# Patient Record
Sex: Male | Born: 1960 | Race: White | Hispanic: No | Marital: Married | Smoking: Never smoker
Health system: Southern US, Community
[De-identification: ages and names within clinical notes are randomized; demographics above are authoritative.]

## PROBLEM LIST (undated history)

## (undated) DIAGNOSIS — F419 Anxiety disorder, unspecified: Secondary | ICD-10-CM

## (undated) DIAGNOSIS — C61 Malignant neoplasm of prostate: Secondary | ICD-10-CM

## (undated) DIAGNOSIS — E119 Type 2 diabetes mellitus without complications: Secondary | ICD-10-CM

## (undated) DIAGNOSIS — F32A Depression, unspecified: Secondary | ICD-10-CM

## (undated) DIAGNOSIS — C4490 Unspecified malignant neoplasm of skin, unspecified: Secondary | ICD-10-CM

## (undated) DIAGNOSIS — D229 Melanocytic nevi, unspecified: Secondary | ICD-10-CM

## (undated) HISTORY — PX: PROSTATE BIOPSY: SHX241

## (undated) HISTORY — PX: SKIN CANCER EXCISION: SHX779

## (undated) HISTORY — PX: COLONOSCOPY: SHX174

## (undated) HISTORY — DX: Melanocytic nevi, unspecified: D22.9

---

## 2009-02-23 ENCOUNTER — Encounter: Admission: RE | Admit: 2009-02-23 | Discharge: 2009-02-23 | Payer: Self-pay | Admitting: Family Medicine

## 2010-03-02 DIAGNOSIS — C4492 Squamous cell carcinoma of skin, unspecified: Secondary | ICD-10-CM

## 2010-03-02 HISTORY — DX: Squamous cell carcinoma of skin, unspecified: C44.92

## 2010-07-04 ENCOUNTER — Encounter: Payer: Self-pay | Admitting: Family Medicine

## 2014-09-24 ENCOUNTER — Ambulatory Visit
Admission: RE | Admit: 2014-09-24 | Discharge: 2014-09-24 | Disposition: A | Discharge status: Home / Self Care | Payer: 59 | Source: Ambulatory Visit | Attending: Family Medicine | Admitting: Family Medicine

## 2014-09-24 ENCOUNTER — Other Ambulatory Visit: Payer: Self-pay | Admitting: Family Medicine

## 2014-09-24 DIAGNOSIS — R059 Cough, unspecified: Secondary | ICD-10-CM

## 2014-09-24 DIAGNOSIS — R05 Cough: Secondary | ICD-10-CM

## 2015-08-23 IMAGING — CR DG CHEST 2V
2 series · 2 of 2 positions shown · non-contrast
Comparison: Chest radiograph 02/23/2009

CLINICAL DATA: Cough for 1 month.  Wheezing at night.

EXAM:
CHEST  2 VIEW

[w chest pa]
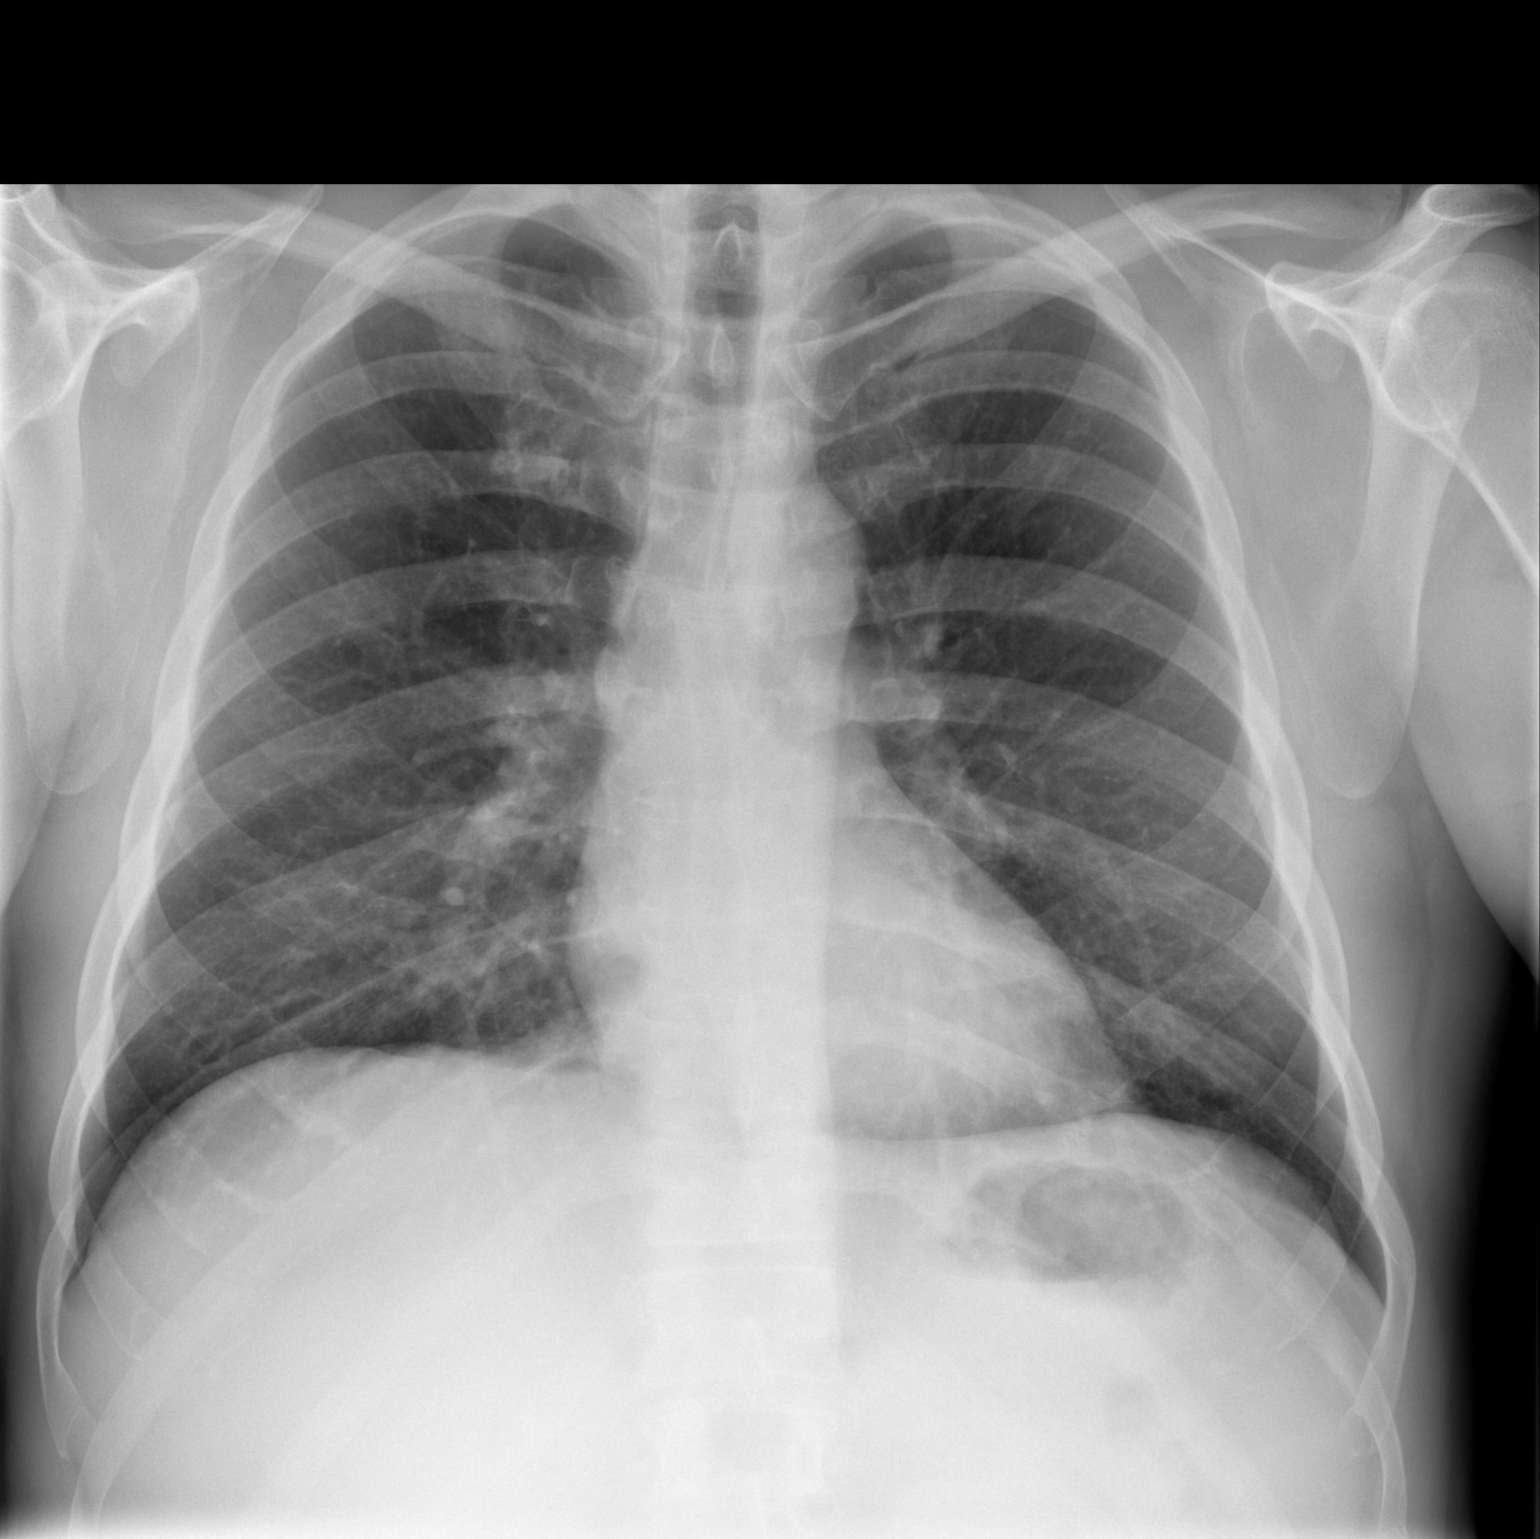

[w chest lat]
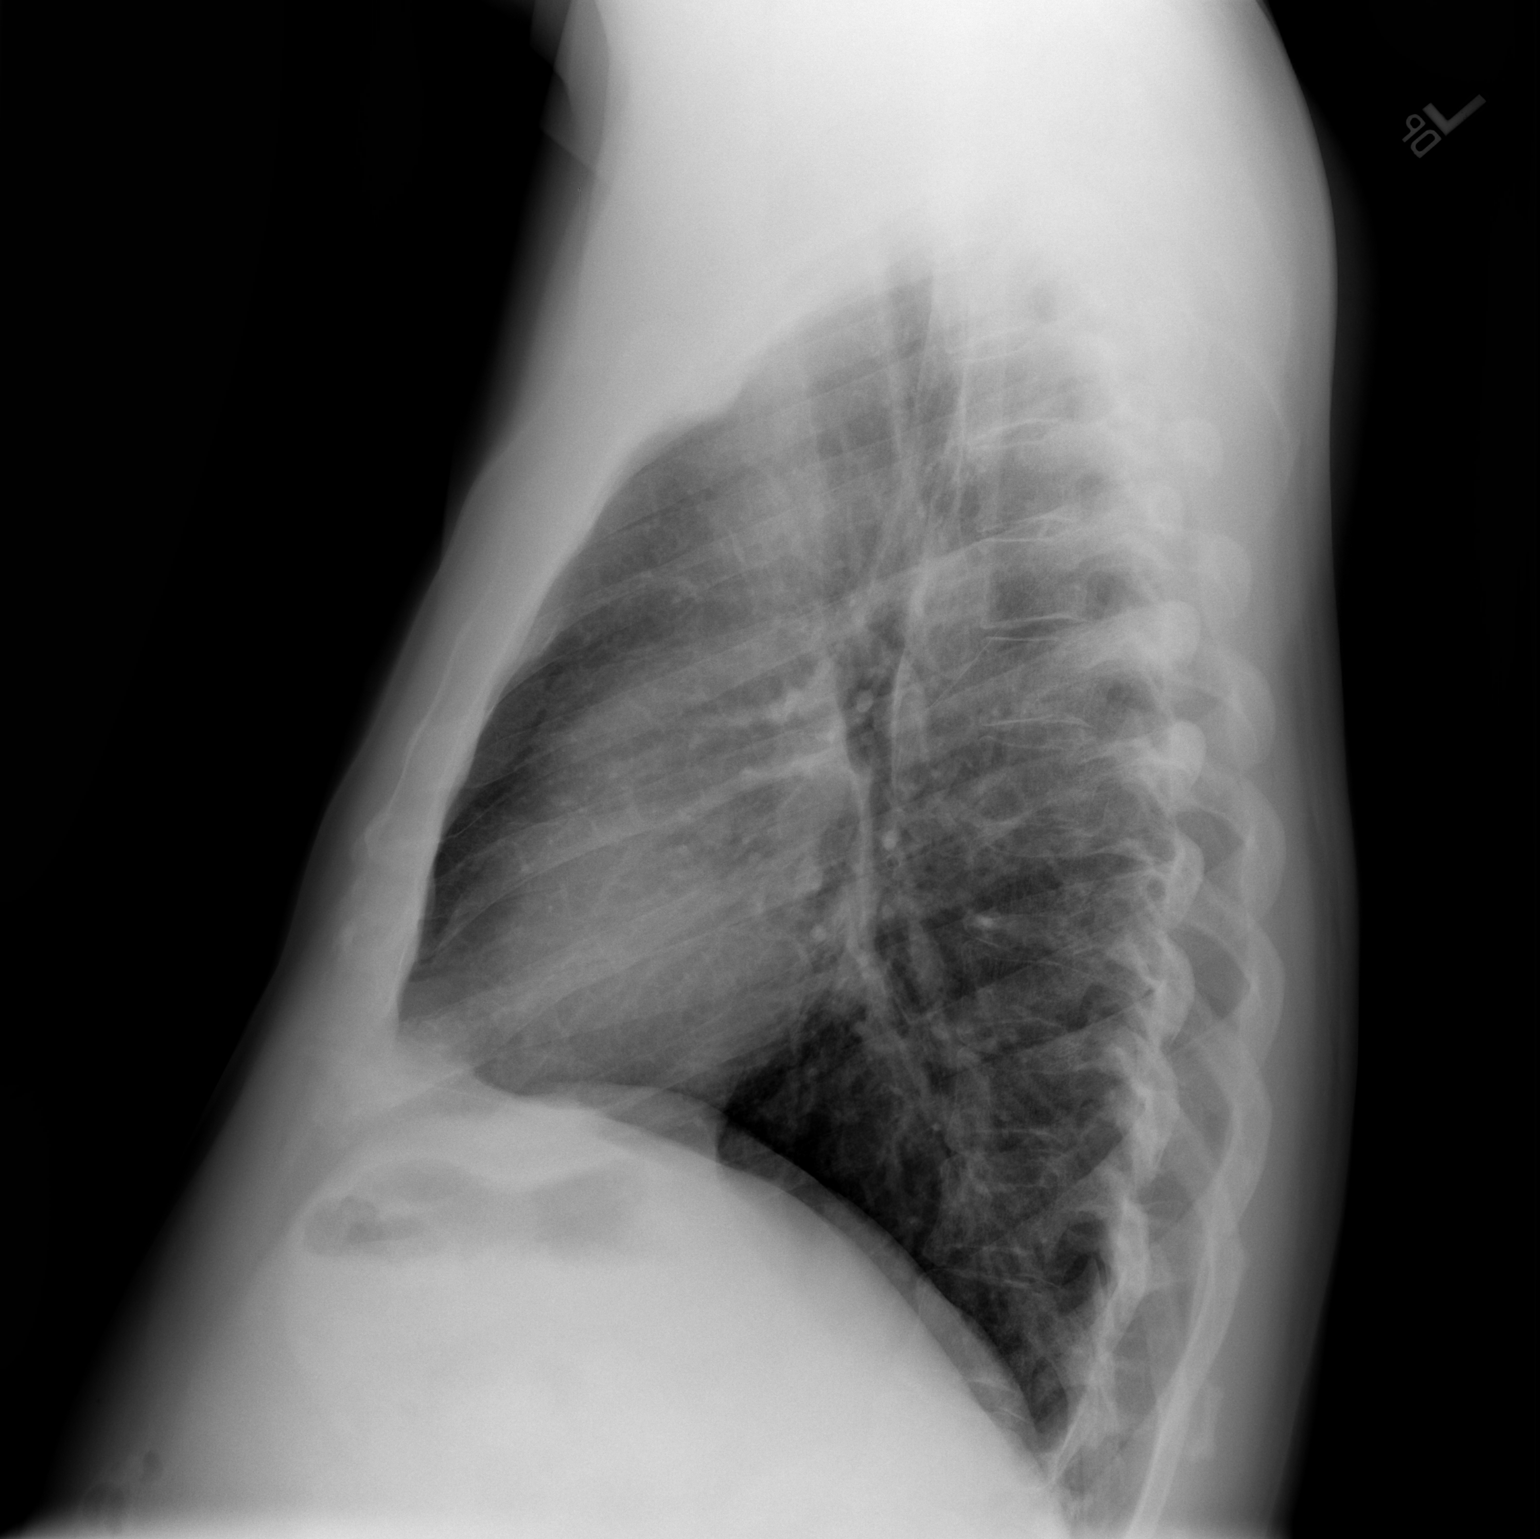

[2 of 2 positions shown; findings below may reference images not displayed]

FINDINGS: Normal mediastinum and cardiac silhouette. Normal pulmonary
vasculature. No evidence of effusion, infiltrate, or pneumothorax.
No acute bony abnormality.
IMPRESSION: No acute cardiopulmonary process.  No change from prior.

## 2015-11-16 DIAGNOSIS — E782 Mixed hyperlipidemia: Secondary | ICD-10-CM | POA: Diagnosis not present

## 2015-11-16 DIAGNOSIS — E039 Hypothyroidism, unspecified: Secondary | ICD-10-CM | POA: Diagnosis not present

## 2015-11-16 DIAGNOSIS — E113299 Type 2 diabetes mellitus with mild nonproliferative diabetic retinopathy without macular edema, unspecified eye: Secondary | ICD-10-CM | POA: Diagnosis not present

## 2015-11-16 DIAGNOSIS — R809 Proteinuria, unspecified: Secondary | ICD-10-CM | POA: Diagnosis not present

## 2016-01-12 DIAGNOSIS — D485 Neoplasm of uncertain behavior of skin: Secondary | ICD-10-CM | POA: Diagnosis not present

## 2016-01-12 DIAGNOSIS — L570 Actinic keratosis: Secondary | ICD-10-CM | POA: Diagnosis not present

## 2016-02-01 DIAGNOSIS — H40053 Ocular hypertension, bilateral: Secondary | ICD-10-CM | POA: Diagnosis not present

## 2016-03-09 DIAGNOSIS — M79604 Pain in right leg: Secondary | ICD-10-CM | POA: Diagnosis not present

## 2016-05-17 DIAGNOSIS — E039 Hypothyroidism, unspecified: Secondary | ICD-10-CM | POA: Diagnosis not present

## 2016-05-17 DIAGNOSIS — R809 Proteinuria, unspecified: Secondary | ICD-10-CM | POA: Diagnosis not present

## 2016-05-17 DIAGNOSIS — E113299 Type 2 diabetes mellitus with mild nonproliferative diabetic retinopathy without macular edema, unspecified eye: Secondary | ICD-10-CM | POA: Diagnosis not present

## 2016-05-17 DIAGNOSIS — E782 Mixed hyperlipidemia: Secondary | ICD-10-CM | POA: Diagnosis not present

## 2016-09-19 DIAGNOSIS — J029 Acute pharyngitis, unspecified: Secondary | ICD-10-CM | POA: Diagnosis not present

## 2016-11-15 DIAGNOSIS — Z0000 Encounter for general adult medical examination without abnormal findings: Secondary | ICD-10-CM | POA: Diagnosis not present

## 2016-11-15 DIAGNOSIS — Z7984 Long term (current) use of oral hypoglycemic drugs: Secondary | ICD-10-CM | POA: Diagnosis not present

## 2016-11-15 DIAGNOSIS — E782 Mixed hyperlipidemia: Secondary | ICD-10-CM | POA: Diagnosis not present

## 2016-11-15 DIAGNOSIS — E1165 Type 2 diabetes mellitus with hyperglycemia: Secondary | ICD-10-CM | POA: Diagnosis not present

## 2016-11-15 DIAGNOSIS — E039 Hypothyroidism, unspecified: Secondary | ICD-10-CM | POA: Diagnosis not present

## 2016-11-15 DIAGNOSIS — R809 Proteinuria, unspecified: Secondary | ICD-10-CM | POA: Diagnosis not present

## 2016-11-15 DIAGNOSIS — Z125 Encounter for screening for malignant neoplasm of prostate: Secondary | ICD-10-CM | POA: Diagnosis not present

## 2016-12-28 DIAGNOSIS — C4492 Squamous cell carcinoma of skin, unspecified: Secondary | ICD-10-CM

## 2016-12-28 DIAGNOSIS — D229 Melanocytic nevi, unspecified: Secondary | ICD-10-CM | POA: Diagnosis not present

## 2016-12-28 DIAGNOSIS — L570 Actinic keratosis: Secondary | ICD-10-CM | POA: Diagnosis not present

## 2016-12-28 DIAGNOSIS — D0439 Carcinoma in situ of skin of other parts of face: Secondary | ICD-10-CM | POA: Diagnosis not present

## 2016-12-28 HISTORY — DX: Squamous cell carcinoma of skin, unspecified: C44.92

## 2017-01-19 DIAGNOSIS — D0439 Carcinoma in situ of skin of other parts of face: Secondary | ICD-10-CM | POA: Diagnosis not present

## 2017-02-20 DIAGNOSIS — E1165 Type 2 diabetes mellitus with hyperglycemia: Secondary | ICD-10-CM | POA: Diagnosis not present

## 2017-04-06 DIAGNOSIS — D492 Neoplasm of unspecified behavior of bone, soft tissue, and skin: Secondary | ICD-10-CM | POA: Diagnosis not present

## 2017-05-19 DIAGNOSIS — E039 Hypothyroidism, unspecified: Secondary | ICD-10-CM | POA: Diagnosis not present

## 2017-05-19 DIAGNOSIS — R809 Proteinuria, unspecified: Secondary | ICD-10-CM | POA: Diagnosis not present

## 2017-05-19 DIAGNOSIS — E782 Mixed hyperlipidemia: Secondary | ICD-10-CM | POA: Diagnosis not present

## 2017-05-19 DIAGNOSIS — E113293 Type 2 diabetes mellitus with mild nonproliferative diabetic retinopathy without macular edema, bilateral: Secondary | ICD-10-CM | POA: Diagnosis not present

## 2017-05-19 DIAGNOSIS — E113299 Type 2 diabetes mellitus with mild nonproliferative diabetic retinopathy without macular edema, unspecified eye: Secondary | ICD-10-CM | POA: Diagnosis not present

## 2017-08-23 DIAGNOSIS — R6884 Jaw pain: Secondary | ICD-10-CM | POA: Diagnosis not present

## 2017-12-12 DIAGNOSIS — Z0000 Encounter for general adult medical examination without abnormal findings: Secondary | ICD-10-CM | POA: Diagnosis not present

## 2017-12-12 DIAGNOSIS — Z125 Encounter for screening for malignant neoplasm of prostate: Secondary | ICD-10-CM | POA: Diagnosis not present

## 2017-12-12 DIAGNOSIS — Z1159 Encounter for screening for other viral diseases: Secondary | ICD-10-CM | POA: Diagnosis not present

## 2017-12-12 DIAGNOSIS — E039 Hypothyroidism, unspecified: Secondary | ICD-10-CM | POA: Diagnosis not present

## 2017-12-12 DIAGNOSIS — E782 Mixed hyperlipidemia: Secondary | ICD-10-CM | POA: Diagnosis not present

## 2017-12-12 DIAGNOSIS — E113299 Type 2 diabetes mellitus with mild nonproliferative diabetic retinopathy without macular edema, unspecified eye: Secondary | ICD-10-CM | POA: Diagnosis not present

## 2018-02-13 DIAGNOSIS — F419 Anxiety disorder, unspecified: Secondary | ICD-10-CM | POA: Diagnosis not present

## 2018-02-20 DIAGNOSIS — G5601 Carpal tunnel syndrome, right upper limb: Secondary | ICD-10-CM | POA: Diagnosis not present

## 2018-02-20 DIAGNOSIS — R202 Paresthesia of skin: Secondary | ICD-10-CM | POA: Diagnosis not present

## 2018-02-20 DIAGNOSIS — M5417 Radiculopathy, lumbosacral region: Secondary | ICD-10-CM | POA: Diagnosis not present

## 2018-02-20 DIAGNOSIS — G5621 Lesion of ulnar nerve, right upper limb: Secondary | ICD-10-CM | POA: Diagnosis not present

## 2018-02-20 DIAGNOSIS — G3184 Mild cognitive impairment, so stated: Secondary | ICD-10-CM | POA: Diagnosis not present

## 2018-02-20 DIAGNOSIS — R201 Hypoesthesia of skin: Secondary | ICD-10-CM | POA: Diagnosis not present

## 2018-02-21 DIAGNOSIS — F419 Anxiety disorder, unspecified: Secondary | ICD-10-CM | POA: Diagnosis not present

## 2018-02-23 DIAGNOSIS — R202 Paresthesia of skin: Secondary | ICD-10-CM | POA: Diagnosis not present

## 2018-02-23 DIAGNOSIS — R413 Other amnesia: Secondary | ICD-10-CM | POA: Diagnosis not present

## 2018-02-23 DIAGNOSIS — R634 Abnormal weight loss: Secondary | ICD-10-CM | POA: Diagnosis not present

## 2018-03-07 DIAGNOSIS — F419 Anxiety disorder, unspecified: Secondary | ICD-10-CM | POA: Diagnosis not present

## 2018-03-14 DIAGNOSIS — M5417 Radiculopathy, lumbosacral region: Secondary | ICD-10-CM | POA: Diagnosis not present

## 2018-03-14 DIAGNOSIS — G3184 Mild cognitive impairment, so stated: Secondary | ICD-10-CM | POA: Diagnosis not present

## 2018-03-14 DIAGNOSIS — G5621 Lesion of ulnar nerve, right upper limb: Secondary | ICD-10-CM | POA: Diagnosis not present

## 2018-03-21 DIAGNOSIS — F419 Anxiety disorder, unspecified: Secondary | ICD-10-CM | POA: Diagnosis not present

## 2018-04-11 DIAGNOSIS — F419 Anxiety disorder, unspecified: Secondary | ICD-10-CM | POA: Diagnosis not present

## 2018-04-12 DIAGNOSIS — G3184 Mild cognitive impairment, so stated: Secondary | ICD-10-CM | POA: Diagnosis not present

## 2018-04-12 DIAGNOSIS — M5417 Radiculopathy, lumbosacral region: Secondary | ICD-10-CM | POA: Diagnosis not present

## 2018-04-12 DIAGNOSIS — R202 Paresthesia of skin: Secondary | ICD-10-CM | POA: Diagnosis not present

## 2018-04-12 DIAGNOSIS — R270 Ataxia, unspecified: Secondary | ICD-10-CM | POA: Diagnosis not present

## 2018-04-25 DIAGNOSIS — F419 Anxiety disorder, unspecified: Secondary | ICD-10-CM | POA: Diagnosis not present

## 2018-05-16 DIAGNOSIS — F419 Anxiety disorder, unspecified: Secondary | ICD-10-CM | POA: Diagnosis not present

## 2018-05-24 DIAGNOSIS — M5417 Radiculopathy, lumbosacral region: Secondary | ICD-10-CM | POA: Diagnosis not present

## 2018-05-24 DIAGNOSIS — R270 Ataxia, unspecified: Secondary | ICD-10-CM | POA: Diagnosis not present

## 2018-05-24 DIAGNOSIS — G5601 Carpal tunnel syndrome, right upper limb: Secondary | ICD-10-CM | POA: Diagnosis not present

## 2018-05-24 DIAGNOSIS — G3184 Mild cognitive impairment, so stated: Secondary | ICD-10-CM | POA: Diagnosis not present

## 2018-06-26 DIAGNOSIS — E113293 Type 2 diabetes mellitus with mild nonproliferative diabetic retinopathy without macular edema, bilateral: Secondary | ICD-10-CM | POA: Diagnosis not present

## 2018-06-26 DIAGNOSIS — E039 Hypothyroidism, unspecified: Secondary | ICD-10-CM | POA: Diagnosis not present

## 2018-06-26 DIAGNOSIS — R809 Proteinuria, unspecified: Secondary | ICD-10-CM | POA: Diagnosis not present

## 2018-06-26 DIAGNOSIS — E782 Mixed hyperlipidemia: Secondary | ICD-10-CM | POA: Diagnosis not present

## 2018-08-08 DIAGNOSIS — R202 Paresthesia of skin: Secondary | ICD-10-CM | POA: Diagnosis not present

## 2018-08-08 DIAGNOSIS — M5417 Radiculopathy, lumbosacral region: Secondary | ICD-10-CM | POA: Diagnosis not present

## 2018-08-08 DIAGNOSIS — G5621 Lesion of ulnar nerve, right upper limb: Secondary | ICD-10-CM | POA: Diagnosis not present

## 2018-08-08 DIAGNOSIS — G3184 Mild cognitive impairment, so stated: Secondary | ICD-10-CM | POA: Diagnosis not present

## 2018-10-31 DIAGNOSIS — M5417 Radiculopathy, lumbosacral region: Secondary | ICD-10-CM | POA: Diagnosis not present

## 2018-10-31 DIAGNOSIS — G5601 Carpal tunnel syndrome, right upper limb: Secondary | ICD-10-CM | POA: Diagnosis not present

## 2018-10-31 DIAGNOSIS — G3184 Mild cognitive impairment, so stated: Secondary | ICD-10-CM | POA: Diagnosis not present

## 2018-10-31 DIAGNOSIS — R413 Other amnesia: Secondary | ICD-10-CM | POA: Diagnosis not present

## 2018-11-22 DIAGNOSIS — R5383 Other fatigue: Secondary | ICD-10-CM | POA: Diagnosis not present

## 2018-11-29 DIAGNOSIS — S80211A Abrasion, right knee, initial encounter: Secondary | ICD-10-CM | POA: Diagnosis not present

## 2018-11-29 DIAGNOSIS — S80212A Abrasion, left knee, initial encounter: Secondary | ICD-10-CM | POA: Diagnosis not present

## 2018-11-29 DIAGNOSIS — S60511A Abrasion of right hand, initial encounter: Secondary | ICD-10-CM | POA: Diagnosis not present

## 2018-11-29 DIAGNOSIS — E860 Dehydration: Secondary | ICD-10-CM | POA: Diagnosis not present

## 2018-12-27 DIAGNOSIS — E113293 Type 2 diabetes mellitus with mild nonproliferative diabetic retinopathy without macular edema, bilateral: Secondary | ICD-10-CM | POA: Diagnosis not present

## 2018-12-27 DIAGNOSIS — E039 Hypothyroidism, unspecified: Secondary | ICD-10-CM | POA: Diagnosis not present

## 2018-12-27 DIAGNOSIS — E782 Mixed hyperlipidemia: Secondary | ICD-10-CM | POA: Diagnosis not present

## 2018-12-27 DIAGNOSIS — R809 Proteinuria, unspecified: Secondary | ICD-10-CM | POA: Diagnosis not present

## 2018-12-28 DIAGNOSIS — R809 Proteinuria, unspecified: Secondary | ICD-10-CM | POA: Diagnosis not present

## 2018-12-28 DIAGNOSIS — E039 Hypothyroidism, unspecified: Secondary | ICD-10-CM | POA: Diagnosis not present

## 2018-12-28 DIAGNOSIS — E782 Mixed hyperlipidemia: Secondary | ICD-10-CM | POA: Diagnosis not present

## 2018-12-28 DIAGNOSIS — E113293 Type 2 diabetes mellitus with mild nonproliferative diabetic retinopathy without macular edema, bilateral: Secondary | ICD-10-CM | POA: Diagnosis not present

## 2018-12-28 DIAGNOSIS — Z125 Encounter for screening for malignant neoplasm of prostate: Secondary | ICD-10-CM | POA: Diagnosis not present

## 2019-01-16 DIAGNOSIS — G5601 Carpal tunnel syndrome, right upper limb: Secondary | ICD-10-CM | POA: Diagnosis not present

## 2019-01-16 DIAGNOSIS — G3184 Mild cognitive impairment, so stated: Secondary | ICD-10-CM | POA: Diagnosis not present

## 2019-01-16 DIAGNOSIS — M5417 Radiculopathy, lumbosacral region: Secondary | ICD-10-CM | POA: Diagnosis not present

## 2019-01-16 DIAGNOSIS — R202 Paresthesia of skin: Secondary | ICD-10-CM | POA: Diagnosis not present

## 2019-01-16 DIAGNOSIS — G5621 Lesion of ulnar nerve, right upper limb: Secondary | ICD-10-CM | POA: Diagnosis not present

## 2019-02-27 DIAGNOSIS — G3184 Mild cognitive impairment, so stated: Secondary | ICD-10-CM | POA: Diagnosis not present

## 2019-02-27 DIAGNOSIS — G5623 Lesion of ulnar nerve, bilateral upper limbs: Secondary | ICD-10-CM | POA: Diagnosis not present

## 2019-02-27 DIAGNOSIS — M5412 Radiculopathy, cervical region: Secondary | ICD-10-CM | POA: Diagnosis not present

## 2019-02-27 DIAGNOSIS — M5417 Radiculopathy, lumbosacral region: Secondary | ICD-10-CM | POA: Diagnosis not present

## 2019-02-27 DIAGNOSIS — G5603 Carpal tunnel syndrome, bilateral upper limbs: Secondary | ICD-10-CM | POA: Diagnosis not present

## 2019-04-17 DIAGNOSIS — G5603 Carpal tunnel syndrome, bilateral upper limbs: Secondary | ICD-10-CM | POA: Diagnosis not present

## 2019-04-17 DIAGNOSIS — M542 Cervicalgia: Secondary | ICD-10-CM | POA: Diagnosis not present

## 2019-04-17 DIAGNOSIS — M5417 Radiculopathy, lumbosacral region: Secondary | ICD-10-CM | POA: Diagnosis not present

## 2019-04-17 DIAGNOSIS — R201 Hypoesthesia of skin: Secondary | ICD-10-CM | POA: Diagnosis not present

## 2019-04-17 DIAGNOSIS — G3184 Mild cognitive impairment, so stated: Secondary | ICD-10-CM | POA: Diagnosis not present

## 2019-07-15 DIAGNOSIS — E039 Hypothyroidism, unspecified: Secondary | ICD-10-CM | POA: Diagnosis not present

## 2019-07-15 DIAGNOSIS — E782 Mixed hyperlipidemia: Secondary | ICD-10-CM | POA: Diagnosis not present

## 2019-07-15 DIAGNOSIS — R809 Proteinuria, unspecified: Secondary | ICD-10-CM | POA: Diagnosis not present

## 2019-07-15 DIAGNOSIS — E113293 Type 2 diabetes mellitus with mild nonproliferative diabetic retinopathy without macular edema, bilateral: Secondary | ICD-10-CM | POA: Diagnosis not present

## 2019-07-17 DIAGNOSIS — G3184 Mild cognitive impairment, so stated: Secondary | ICD-10-CM | POA: Diagnosis not present

## 2019-07-17 DIAGNOSIS — M542 Cervicalgia: Secondary | ICD-10-CM | POA: Diagnosis not present

## 2019-07-17 DIAGNOSIS — M5417 Radiculopathy, lumbosacral region: Secondary | ICD-10-CM | POA: Diagnosis not present

## 2019-07-17 DIAGNOSIS — G5603 Carpal tunnel syndrome, bilateral upper limbs: Secondary | ICD-10-CM | POA: Diagnosis not present

## 2020-01-13 DIAGNOSIS — E782 Mixed hyperlipidemia: Secondary | ICD-10-CM | POA: Diagnosis not present

## 2020-01-13 DIAGNOSIS — E113299 Type 2 diabetes mellitus with mild nonproliferative diabetic retinopathy without macular edema, unspecified eye: Secondary | ICD-10-CM | POA: Diagnosis not present

## 2020-01-13 DIAGNOSIS — R809 Proteinuria, unspecified: Secondary | ICD-10-CM | POA: Diagnosis not present

## 2020-01-13 DIAGNOSIS — E039 Hypothyroidism, unspecified: Secondary | ICD-10-CM | POA: Diagnosis not present

## 2020-04-01 DIAGNOSIS — I10 Essential (primary) hypertension: Secondary | ICD-10-CM | POA: Diagnosis not present

## 2020-04-01 DIAGNOSIS — M5416 Radiculopathy, lumbar region: Secondary | ICD-10-CM | POA: Diagnosis not present

## 2020-04-14 DIAGNOSIS — M5416 Radiculopathy, lumbar region: Secondary | ICD-10-CM | POA: Diagnosis not present

## 2020-04-14 DIAGNOSIS — M545 Low back pain, unspecified: Secondary | ICD-10-CM | POA: Diagnosis not present

## 2020-04-22 DIAGNOSIS — M5416 Radiculopathy, lumbar region: Secondary | ICD-10-CM | POA: Diagnosis not present

## 2020-05-20 DIAGNOSIS — I10 Essential (primary) hypertension: Secondary | ICD-10-CM | POA: Diagnosis not present

## 2020-05-20 DIAGNOSIS — Z6825 Body mass index (BMI) 25.0-25.9, adult: Secondary | ICD-10-CM | POA: Diagnosis not present

## 2020-05-20 DIAGNOSIS — M5416 Radiculopathy, lumbar region: Secondary | ICD-10-CM | POA: Diagnosis not present

## 2020-06-10 ENCOUNTER — Ambulatory Visit (INDEPENDENT_AMBULATORY_CARE_PROVIDER_SITE_OTHER): Payer: BC Managed Care – PPO | Admitting: Dermatology

## 2020-06-10 ENCOUNTER — Encounter: Payer: Self-pay | Admitting: Dermatology

## 2020-06-10 ENCOUNTER — Other Ambulatory Visit: Payer: Self-pay

## 2020-06-10 DIAGNOSIS — Z86007 Personal history of in-situ neoplasm of skin: Secondary | ICD-10-CM

## 2020-06-10 DIAGNOSIS — Z1283 Encounter for screening for malignant neoplasm of skin: Secondary | ICD-10-CM

## 2020-06-10 DIAGNOSIS — Z86018 Personal history of other benign neoplasm: Secondary | ICD-10-CM | POA: Diagnosis not present

## 2020-06-10 DIAGNOSIS — L570 Actinic keratosis: Secondary | ICD-10-CM | POA: Diagnosis not present

## 2020-06-10 DIAGNOSIS — L309 Dermatitis, unspecified: Secondary | ICD-10-CM | POA: Diagnosis not present

## 2020-06-10 DIAGNOSIS — C4491 Basal cell carcinoma of skin, unspecified: Secondary | ICD-10-CM

## 2020-06-10 DIAGNOSIS — L821 Other seborrheic keratosis: Secondary | ICD-10-CM

## 2020-06-10 DIAGNOSIS — C44612 Basal cell carcinoma of skin of right upper limb, including shoulder: Secondary | ICD-10-CM

## 2020-06-10 HISTORY — DX: Basal cell carcinoma of skin, unspecified: C44.91

## 2020-06-10 MED ORDER — BETAMETHASONE DIPROPIONATE 0.05 % EX CREA: TOPICAL_CREAM | Freq: Every evening | CUTANEOUS | 11 refills | Status: DC

## 2020-06-10 NOTE — Patient Instructions (Signed)

## 2020-06-15 ENCOUNTER — Encounter: Payer: Self-pay | Admitting: Dermatology

## 2020-06-16 NOTE — Progress Notes (Addendum)
New Patient   Subjective  Ricardo Lutz is a 60 y.o. male who presents for the following: Annual Exam (Under left eye right new just a few weeks, forehead and right forearm crust x months).  Several new growths face and right arm Location:  Duration:  Quality:  Associated Signs/Symptoms: Modifying Factors:  Severity:  Timing: Context:    The following portions of the chart were reviewed this encounter and updated as appropriate:  Tobacco  Allergies  Meds  Problems  Med Hx  Surg Hx  Fam Hx      Objective  Well appearing patient in no apparent distress; mood and affect are within normal limits. Objective  Head - Anterior (Face): Head to leg exam today no signs of atypical moles, or melanoma.  Objective  Left Chest, Left Side, Right Outer Abdomen, Right Sup Umbilicus: No sign recurrence  Objective  Left Cheek, Mid Tip of Nose: No sign recurrence  Objective  Right Lower Leg - Anterior: Mildly inflamed nonmarginated 1.5 cm eczematous circular patch  Objective  Left Lower Leg - Anterior, Mid Back: Flattopped brown 4 to 5 mm papules  Objective  Left Buccal Cheek, Left Malar Cheek, Right Forehead: Erythematous patches with gritty scale.  Objective  Right Inner Forearm: Flat pink crust        All skin waist up examined.   Assessment & Plan  Skin exam for malignant neoplasm Head - Anterior (Face)  Yearly skin check  History of atypical skin mole (4) Left Chest; Right Outer Abdomen; Left Side; Right Sup Umbilicus  Encouraged to self examine his skin twice annually  History of squamous cell carcinoma in situ (SCCIS) of skin (2) Mid Tip of Nose; Left Cheek  Recheck as needed clinical change  Eczema, unspecified type Right Lower Leg - Anterior  Use new topical cream: Betamethasone dipropionate.  Apply daily after bathing for 2 to 4 weeks and then contact office with status report  betamethasone dipropionate 0.05 % cream - Right Lower  Leg - Anterior  Seborrheic keratosis (2) Left Lower Leg - Anterior; Mid Back  Leave if stable  AK (actinic keratosis) (3) Left Malar Cheek; Left Buccal Cheek; Right Forehead  Destruction of lesion - Left Buccal Cheek, Left Malar Cheek, Right Forehead Complexity: simple   Destruction method: cryotherapy   Informed consent: discussed and consent obtained   Lesion destroyed using liquid nitrogen: Yes   Cryotherapy cycles:  3 Outcome: patient tolerated procedure well with no complications    BCC (basal cell carcinoma), arm, right Right Inner Forearm  Destruction of lesion Complexity: simple   Destruction method: electrodesiccation and curettage   Informed consent: discussed and consent obtained   Timeout:  patient name, date of birth, surgical site, and procedure verified Anesthesia: the lesion was anesthetized in a standard fashion   Anesthetic:  1% lidocaine w/ epinephrine 1-100,000 local infiltration Curettage performed in three different directions: Yes   Electrodesiccation performed over the curetted area: Yes   Curettage cycles:  1 Lesion length (cm):  0.7 Lesion width (cm):  0.7 Margin per side (cm):  0 Final wound size (cm):  0.7 Hemostasis achieved with:  ferric subsulfate and electrodesiccation Outcome: patient tolerated procedure well with no complications   Post-procedure details: wound care instructions given    Specimen 1 - Surgical pathology Differential Diagnosis: R/O BCC vs SCC  Check Margins: No  Treated after biopsy     I, Janalyn Harder, MD, have reviewed all documentation for this visit.  The  documentation on 06/26/20 for the exam, diagnosis, procedures, and orders are all accurate and complete.

## 2020-06-17 ENCOUNTER — Telehealth: Payer: Self-pay

## 2020-06-17 NOTE — Telephone Encounter (Signed)
-----   Message from Stuart Tafeen, MD sent at 06/16/2020  4:50 AM EST ----- Please inform patient that the biopsy showed a low risk superficial nonmelanoma skin cancer and the base was treated with the biopsy so most of these will already be cured.  Schedule a quick look in 3 months but if it is perfectly smooth he can cancel and we will see him in 1 year. 

## 2020-06-17 NOTE — Telephone Encounter (Signed)
Phone call to patient with his pathology results. Voicemail left for patient to give the office a call back.  ?

## 2020-06-24 NOTE — Telephone Encounter (Signed)
Phone call to patient with his pathology results. Voicemail left for patient to give the office a call back.  ?

## 2020-06-24 NOTE — Telephone Encounter (Signed)
-----   Message from Lavonna Monarch, MD sent at 06/16/2020  4:50 AM EST ----- Please inform patient that the biopsy showed a low risk superficial nonmelanoma skin cancer and the base was treated with the biopsy so most of these will already be cured.  Schedule a quick look in 3 months but if it is perfectly smooth he can cancel and we will see him in 1 year.

## 2020-06-25 NOTE — Telephone Encounter (Signed)
Patient aware of results front staff to make f/u

## 2020-09-07 ENCOUNTER — Ambulatory Visit: Payer: BC Managed Care – PPO | Admitting: Dermatology

## 2020-09-09 ENCOUNTER — Ambulatory Visit: Payer: BC Managed Care – PPO | Admitting: Dermatology

## 2023-10-12 ENCOUNTER — Other Ambulatory Visit (HOSPITAL_COMMUNITY): Payer: Self-pay | Admitting: Urology

## 2023-10-12 DIAGNOSIS — C61 Malignant neoplasm of prostate: Secondary | ICD-10-CM

## 2023-11-01 ENCOUNTER — Encounter (HOSPITAL_COMMUNITY)
Admission: RE | Admit: 2023-11-01 | Discharge: 2023-11-01 | Disposition: A | Discharge status: Home / Self Care | Payer: Self-pay | Source: Ambulatory Visit | Attending: Urology

## 2023-11-01 ENCOUNTER — Ambulatory Visit (HOSPITAL_COMMUNITY)
Admission: RE | Admit: 2023-11-01 | Discharge: 2023-11-01 | Disposition: A | Discharge status: Home / Self Care | Payer: Self-pay | Source: Ambulatory Visit | Attending: Urology | Admitting: Urology

## 2023-11-01 DIAGNOSIS — C61 Malignant neoplasm of prostate: Secondary | ICD-10-CM | POA: Insufficient documentation

## 2023-11-01 MED ORDER — TECHNETIUM TC 99M MEDRONATE IV KIT
19.9000 | PACK | Freq: Once | INTRAVENOUS | Status: AC
  Administered 2023-11-01: 19.9 via INTRAVENOUS

## 2023-11-01 MED ORDER — IOHEXOL 300 MG/ML  SOLN
100.0000 mL | Freq: Once | INTRAMUSCULAR | Status: AC | PRN
  Administered 2023-11-01: 100 mL via INTRAVENOUS

## 2023-11-07 ENCOUNTER — Other Ambulatory Visit (HOSPITAL_COMMUNITY): Payer: Self-pay | Admitting: Urology

## 2023-11-07 DIAGNOSIS — C61 Malignant neoplasm of prostate: Secondary | ICD-10-CM

## 2023-11-15 ENCOUNTER — Encounter (HOSPITAL_COMMUNITY)
Admission: RE | Admit: 2023-11-15 | Discharge: 2023-11-15 | Disposition: A | Discharge status: Home / Self Care | Source: Ambulatory Visit | Attending: Urology | Admitting: Urology

## 2023-11-15 DIAGNOSIS — C61 Malignant neoplasm of prostate: Secondary | ICD-10-CM | POA: Insufficient documentation

## 2023-11-15 MED ORDER — FLOTUFOLASTAT F 18 GALLIUM 296-5846 MBQ/ML IV SOLN
8.1300 | Freq: Once | INTRAVENOUS | Status: AC
  Administered 2023-11-15: 8.13 via INTRAVENOUS

## 2023-11-24 ENCOUNTER — Other Ambulatory Visit: Payer: Self-pay | Admitting: Urology

## 2023-12-26 ENCOUNTER — Other Ambulatory Visit: Payer: Self-pay | Admitting: Urology

## 2023-12-26 ENCOUNTER — Encounter (HOSPITAL_COMMUNITY): Payer: Self-pay

## 2023-12-26 NOTE — Patient Instructions (Addendum)
 SURGICAL WAITING ROOM VISITATION Patients having surgery or a procedure may have no more than 2 support people in the waiting area - these visitors may rotate.    Children under the age of 44 must have an adult with them who is not the patient.  If the patient needs to stay at the hospital during part of their recovery, the visitor guidelines for inpatient rooms apply. Pre-op nurse will coordinate an appropriate time for 1 support person to accompany patient in pre-op.  This support person may not rotate.    Please refer to the [facility] website for the visitor guidelines for Inpatients (after your surgery is over and you are in a regular room).       Your procedure is scheduled on: 01-09-24   Report to [facility] Main Entrance    Report to admitting at 5:15 AM   Call this number if you have problems the morning of surgery [phone]   Follow a clear liquid diet the day before surgery    Do not eat food or drink liquids :After Midnight.          If you have questions, please contact your surgeon's office.   FOLLOW BOWEL PREP AND ANY ADDITIONAL PRE OP INSTRUCTIONS YOU RECEIVED FROM YOUR SURGEON'S OFFICE!!!  Magnesium citrate - Drink 8 ounces at noon the day before surgery     Oral Hygiene is also important to reduce your risk of infection.                                    Remember - BRUSH YOUR TEETH THE MORNING OF SURGERY WITH YOUR REGULAR TOOTHPASTE   Do NOT smoke after Midnight   Take these medicines the morning of surgery with A SIP OF WATER:    Duloxetine   Rosuvastatin   Zyrtec    Stop all vitamins and herbal supplements 7 days before surgery  How to Manage Your Diabetes Before and After Surgery  Why is it important to control my blood sugar before and after surgery? Improving blood sugar levels before and after surgery helps healing and can limit problems. A way of improving blood sugar control is eating a healthy diet by:  Eating less sugar and  carbohydrates  Increasing activity/exercise  Talking with your doctor about reaching your blood sugar goals High blood sugars (greater than 180 mg/dL) can raise your risk of infections and slow your recovery, so you will need to focus on controlling your diabetes during the weeks before surgery. Make sure that the doctor who takes care of your diabetes knows about your planned surgery including the date and location.  How do I manage my blood sugar before surgery? Check your blood sugar at least 4 times a day, starting 2 days before surgery, to make sure that the level is not too high or low. Check your blood sugar the morning of your surgery when you wake up and every 2 hours until you get to the Short Stay unit. If your blood sugar is less than 70 mg/dL, you will need to treat for low blood sugar: Do not take insulin. Treat a low blood sugar (less than 70 mg/dL) with  cup of clear juice (cranberry or apple), 4 glucose tablets, OR glucose gel. Recheck blood sugar in 15 minutes after treatment (to make sure it is greater than 70 mg/dL). If your blood sugar is not greater than 70  mg/dL on recheck, call [phone] for further instructions. Report your blood sugar to the short stay nurse when you get to Short Stay.  If you are admitted to the hospital after surgery: Your blood sugar will be checked by the staff and you will probably be given insulin after surgery (instead of oral diabetes medicines) to make sure you have good blood sugar levels. The goal for blood sugar control after surgery is 80-180 mg/dL.   WHAT DO I DO ABOUT MY DIABETES MEDICATION?  Do not take oral diabetes medicines (pills) the morning of surgery. (Do not take Metformin the morning of surgery)  DO NOT TAKE THE FOLLOWING 7 DAYS PRIOR TO SURGERY: Ozempic, Wegovy, Rybelsus (Semaglutide), Byetta (exenatide), Bydureon (exenatide ER), Victoza, Saxenda (liraglutide), or Trulicity (dulaglutide) Mounjaro (Tirzepatide) Adlyxin  (Lixisenatide), Polyethylene Glycol Loxenatide.  Reviewed and Endorsed by [facility] Patient Education Committee, August 2015  Bring CPAP mask and tubing day of surgery.                              You may not have any metal on your body including jewelry, and body piercing             Do not wear  lotions, powders, cologne, or deodorant              Men may shave face and neck.   Do not bring valuables to the hospital. [facility] IS NOT RESPONSIBLE   FOR VALUABLES.   Contacts, dentures or bridgework may not be worn into surgery.   Bring small overnight bag day of surgery.   DO NOT BRING YOUR HOME MEDICATIONS TO THE HOSPITAL. PHARMACY WILL DISPENSE MEDICATIONS LISTED ON YOUR MEDICATION LIST TO YOU DURING YOUR ADMISSION IN THE HOSPITAL!               Please read over the following fact sheets you were given: IF YOU HAVE QUESTIONS ABOUT YOUR PRE-OP INSTRUCTIONS PLEASE CALL [phone] Gwen  If you received a COVID test during your pre-op visit  it is requested that you wear a mask when out in public, stay away from anyone that may not be feeling well and notify your surgeon if you develop symptoms. If you test positive for Covid or have been in contact with anyone that has tested positive in the last 10 days please notify you surgeon.   - Preparing for Surgery Before surgery, you can play an important role.  Because skin is not sterile, your skin needs to be as free of germs as possible.  You can reduce the number of germs on your skin by washing with CHG (chlorahexidine gluconate) soap before surgery.  CHG is an antiseptic cleaner which kills germs and bonds with the skin to continue killing germs even after washing. Please DO NOT use if you have an allergy to CHG or antibacterial soaps.  If your skin becomes reddened/irritated stop using the CHG and inform your nurse when you arrive at Short Stay. Do not shave (including legs and underarms) for at least 48 hours prior to  the first CHG shower.  You may shave your face/neck.  Please follow these instructions carefully:  1.  Shower with CHG Soap the night before surgery and the  morning of surgery.  2.  If you choose to wash your hair, wash your hair first as usual with your normal  shampoo.  3.  After you shampoo, rinse your hair  and body thoroughly to remove the shampoo.                             4.  Use CHG as you would any other liquid soap.  You can apply chg directly to the skin and wash.  Gently with a scrungie or clean washcloth.  5.  Apply the CHG Soap to your body ONLY FROM THE NECK DOWN.   Do   not use on face/ open                           Wound or open sores. Avoid contact with eyes, ears mouth and   genitals (private parts).                       Wash face,  Genitals (private parts) with your normal soap.             6.  Wash thoroughly, paying special attention to the area where your    surgery  will be performed.  7.  Thoroughly rinse your body with warm water from the neck down.  8.  DO NOT shower/wash with your normal soap after using and rinsing off the CHG Soap.                9.  Pat yourself dry with a clean towel.            10.  Wear clean pajamas.            11.  Place clean sheets on your bed the night of your first shower and do not  sleep with pets. Day of Surgery : Do not apply any lotions/deodorants the morning of surgery.  Please wear clean clothes to the hospital/surgery center.  FAILURE TO FOLLOW THESE INSTRUCTIONS MAY RESULT IN THE CANCELLATION OF YOUR SURGERY  PATIENT SIGNATURE_________________________________  NURSE SIGNATURE__________________________________  ________________________________________________________________________   WHAT IS A BLOOD TRANSFUSION? Blood Transfusion Information  A transfusion is the replacement of blood or some of its parts. Blood is made up of multiple cells which provide different functions. Red blood cells carry oxygen and are used  for blood loss replacement. White blood cells fight against infection. Platelets control bleeding. Plasma helps clot blood. Other blood products are available for specialized needs, such as hemophilia or other clotting disorders. BEFORE THE TRANSFUSION  Who gives blood for transfusions?  Healthy volunteers who are fully evaluated to make sure their blood is safe. This is blood bank blood. Transfusion therapy is the safest it has ever been in the practice of medicine. Before blood is taken from a donor, a complete history is taken to make sure that person has no history of diseases nor engages in risky social behavior (examples are intravenous drug use or sexual activity with multiple partners). The donor's travel history is screened to minimize risk of transmitting infections, such as malaria. The donated blood is tested for signs of infectious diseases, such as HIV and hepatitis. The blood is then tested to be sure it is compatible with you in order to minimize the chance of a transfusion reaction. If you or a relative donates blood, this is often done in anticipation of surgery and is not appropriate for emergency situations. It takes many days to process the donated blood. RISKS AND COMPLICATIONS Although transfusion therapy is very safe and saves many lives, the main dangers  of transfusion include:  Getting an infectious disease. Developing a transfusion reaction. This is an allergic reaction to something in the blood you were given. Every precaution is taken to prevent this. The decision to have a blood transfusion has been considered carefully by your caregiver before blood is given. Blood is not given unless the benefits outweigh the risks. AFTER THE TRANSFUSION Right after receiving a blood transfusion, you will usually feel much better and more energetic. This is especially true if your red blood cells have gotten low (anemic). The transfusion raises the level of the red blood cells which  carry oxygen, and this usually causes an energy increase. The nurse administering the transfusion will monitor you carefully for complications. HOME CARE INSTRUCTIONS  No special instructions are needed after a transfusion. You may find your energy is better. Speak with your caregiver about any limitations on activity for underlying diseases you may have. SEEK MEDICAL CARE IF:  Your condition is not improving after your transfusion. You develop redness or irritation at the intravenous (IV) site. SEEK IMMEDIATE MEDICAL CARE IF:  Any of the following symptoms occur over the next 12 hours: Shaking chills. You have a temperature by mouth above 102 F (38.9 C), not controlled by medicine. Chest, back, or muscle pain. People around you feel you are not acting correctly or are confused. Shortness of breath or difficulty breathing. Dizziness and fainting. You get a rash or develop hives. You have a decrease in urine output. Your urine turns a dark color or changes to pink, red, or brown. Any of the following symptoms occur over the next 10 days: You have a temperature by mouth above 102 F (38.9 C), not controlled by medicine. Shortness of breath. Weakness after normal activity. The white part of the eye turns yellow (jaundice). You have a decrease in the amount of urine or are urinating less often. Your urine turns a dark color or changes to pink, red, or brown. Document Released: 05/27/2000 Document Revised: 08/22/2011 Document Reviewed: 01/14/2008 [facility] Patient Information 2014 [place], [place].  _______________________________________________________________________

## 2023-12-26 NOTE — Progress Notes (Addendum)
  Date of COVID positive in last 90 days:  PCP - Cheryle Frees, MD Cardiologist -   Chest x-ray -  EKG -  Stress Test -  ECHO -  Cardiac Cath -  Pacemaker/ICD device last checked: Spinal Cord Stimulator:  Bowel Prep -   Sleep Study -  CPAP -   Fasting Blood Sugar -  Checks Blood Sugar _____ times a day  Rybelsus qd Last dose of GLP1 agonist-  N/A GLP1 instructions:  Do not take after     [place] Last dose of SGLT-2 inhibitors-  N/A SGLT-2 instructions:  Do not take after  01-05-24  Blood Thinner Instructions:  Last dose:   Time: Aspirin Instructions:  ASA 81 Last Dose:  Activity level:  Can go up a flight of stairs and perform activities of daily living without stopping and without symptoms of chest pain or shortness of breath.  Able to exercise without symptoms  Unable to go up a flight of stairs without symptoms of     Anesthesia review:   Patient denies shortness of breath, fever, cough and chest pain at PAT appointment  Patient verbalized understanding of instructions that were given to them at the PAT appointment. Patient was also instructed that they will need to review over the PAT instructions again at home before surgery.

## 2023-12-28 ENCOUNTER — Encounter (HOSPITAL_COMMUNITY)
Admission: RE | Admit: 2023-12-28 | Discharge: 2023-12-28 | Disposition: A | Discharge status: Home / Self Care | Source: Ambulatory Visit | Attending: Urology | Admitting: Urology

## 2023-12-28 ENCOUNTER — Encounter (HOSPITAL_COMMUNITY): Payer: Self-pay

## 2023-12-28 ENCOUNTER — Other Ambulatory Visit: Payer: Self-pay

## 2023-12-28 VITALS — BP 144/102 | HR 82 | Temp 98.6°F | Resp 16 | Ht 74.0 in | Wt 194.2 lb

## 2023-12-28 DIAGNOSIS — Z01818 Encounter for other preprocedural examination: Secondary | ICD-10-CM | POA: Insufficient documentation

## 2023-12-28 DIAGNOSIS — E119 Type 2 diabetes mellitus without complications: Secondary | ICD-10-CM | POA: Diagnosis not present

## 2023-12-28 HISTORY — DX: Unspecified malignant neoplasm of skin, unspecified: C44.90

## 2023-12-28 HISTORY — DX: Malignant neoplasm of prostate: C61

## 2023-12-28 HISTORY — DX: Type 2 diabetes mellitus without complications: E11.9

## 2023-12-28 HISTORY — DX: Depression, unspecified: F32.A

## 2023-12-28 HISTORY — DX: Anxiety disorder, unspecified: F41.9

## 2023-12-28 LAB — BASIC METABOLIC PANEL WITH GFR
Anion gap: 10 (ref 5–15)
BUN: 24 mg/dL — ABNORMAL HIGH (ref 8–23)
CO2: 25 mmol/L (ref 22–32)
Calcium: 9.8 mg/dL (ref 8.9–10.3)
Chloride: 99 mmol/L (ref 98–111)
Creatinine, Ser: 1.02 mg/dL (ref 0.61–1.24)
GFR, Estimated: >60 mL/min (ref >60)
Glucose, Bld: 222 mg/dL — ABNORMAL HIGH (ref 70–99)
Potassium: 4.2 mmol/L (ref 3.5–5.1)
Sodium: 134 mmol/L — ABNORMAL LOW (ref 135–145)

## 2023-12-28 LAB — HEMOGLOBIN A1C
Hgb A1c MFr Bld: 8.5 % — ABNORMAL HIGH (ref 4.8–5.6)
Mean Plasma Glucose: 197.25 mg/dL

## 2023-12-28 LAB — CBC
HCT: 44.2 % (ref 39.0–52.0)
Hemoglobin: 15.2 g/dL (ref 13.0–17.0)
MCH: 31.3 pg (ref 26.0–34.0)
MCHC: 34.4 g/dL (ref 30.0–36.0)
MCV: 91.1 fL (ref 80.0–100.0)
Platelets: 172 K/uL (ref 150–400)
RBC: 4.85 MIL/uL (ref 4.22–5.81)
RDW: 11.9 % (ref 11.5–15.5)
WBC: 6.4 K/uL (ref 4.0–10.5)
nRBC: 0 % (ref 0.0–0.2)

## 2023-12-28 LAB — GLUCOSE, CAPILLARY: Glucose-Capillary: 224 mg/dL — ABNORMAL HIGH (ref 70–99)

## 2024-01-01 ENCOUNTER — Encounter (HOSPITAL_COMMUNITY): Payer: Self-pay

## 2024-01-01 NOTE — Anesthesia Preprocedure Evaluation (Addendum)
 Anesthesia Evaluation  Patient identified by MRN, date of birth, ID band Patient awake    Reviewed: Allergy & Precautions, NPO status , Patient's Chart, lab work & pertinent test results  History of Anesthesia Complications Negative for: history of anesthetic complications  Airway Mallampati: II  TM Distance: >3 FB Neck ROM: Full    Dental  (+) Dental Advisory Given   Pulmonary sleep apnea (does not use CPAP)    breath sounds clear to auscultation       Cardiovascular negative cardio ROS  Rhythm:Regular Rate:Normal     Neuro/Psych   Anxiety Depression    negative neurological ROS     GI/Hepatic negative GI ROS, Neg liver ROS,,,  Endo/Other  diabetes (glu 137), Oral Hypoglycemic Agents    Renal/GU negative Renal ROS   Prostate cancer    Musculoskeletal   Abdominal   Peds  Hematology Hb 15.2, plt 172k   Anesthesia Other Findings   Reproductive/Obstetrics                              Anesthesia Physical Anesthesia Plan  ASA: 3  Anesthesia Plan: General   Post-op Pain Management: Tylenol  PO (pre-op)*   Induction: Intravenous  PONV Risk Score and Plan: 2 and Ondansetron  and Dexamethasone   Airway Management Planned: Oral ETT  Additional Equipment: None  Intra-op Plan:   Post-operative Plan: Extubation in OR  Informed Consent: I have reviewed the patients History and Physical, chart, labs and discussed the procedure including the risks, benefits and alternatives for the proposed anesthesia with the patient or authorized representative who has indicated his/her understanding and acceptance.     Dental advisory given  Plan Discussed with: CRNA and Surgeon  Anesthesia Plan Comments: (Reviewed. Hx of HTN, OSA, uncontrolled DM (A1c 8.5), anxiety, depression. Seen by PCP on 11/03/23. BP mildly elevated at PAT visit but patient anxious per RN. EKG NSR.)         Anesthesia  Quick Evaluation

## 2024-01-07 NOTE — H&P (Signed)
 63 year old gentleman referred for elevated PSA of 7.35.   PMH: DM2, A1c of 9.2, OSA no CPAP, HLD  PSH: no abdominal surgery, never had surgery prior   Elevated PSA:  07/19/2023: no FH of PCA, no recent new urinary symptoms. AUASS is 8, no GH, no GYN or breast cancer in family. No UTI. UA is negative for blood. No recent abx use, no blood thinners.  10/04/23: prostate bx today  10/11/23: GG3 L lateral base and mid, AUASS 9, QOL2, SHIM 24, no ED. Prostate size 26cc. PVR 7, on no ED meds.  12/20/2023: PET scan demonstrated with equivocal left rib patient's, more to offer primary treatment patient would like to pursue RALP. Here today for RALP discussion. Patient is currently scheduled for 01/09/2024. Discussed surgery today, labs sent. 01/09/24: Robotic prostatectomy planned today, Will nerve spare Right side, no nerve spare left side.     ALLERGIES: No Known Allergies    MEDICATIONS: Bactrim  DS 800-160 MG Tablet 1 tablet PO As Directed please take 1 pill 1 hour prior to biopsy then 2nd pill at bedtime after biopsy     GU PSH: Prostate Needle Biopsy - 10/04/2023     NON-GU PSH: Surgical Pathology, Gross And Microscopic Examination For Prostate Needle - 10/04/2023 Visit Complexity (formerly GPC1X) - 10/11/2023, 10/04/2023, 08/16/2023     GU PMH: Prostate Cancer - 11/30/2023, - 10/11/2023 Elevated PSA - 10/04/2023, - 08/16/2023    NON-GU PMH: Muscle weakness (generalized) - 11/30/2023    FAMILY HISTORY: No Family History    SOCIAL HISTORY: No Social History    REVIEW OF SYSTEMS:    GU Review Male:   Patient denies frequent urination, hard to postpone urination, burning/ pain with urination, get up at night to urinate, leakage of urine, stream starts and stops, trouble starting your stream, have to strain to urinate , erection problems, and penile pain.  Gastrointestinal (Upper):   Patient denies nausea, vomiting, and indigestion/ heartburn.  Gastrointestinal (Lower):   Patient denies diarrhea and  constipation.  Constitutional:   Patient denies fever, night sweats, weight loss, and fatigue.  Skin:   Patient denies skin rash/ lesion and itching.  Eyes:   Patient denies blurred vision and double vision.  Ears/ Nose/ Throat:   Patient denies sore throat and sinus problems.  Hematologic/Lymphatic:   Patient denies swollen glands and easy bruising.  Cardiovascular:   Patient denies leg swelling and chest pains.  Respiratory:   Patient denies cough and shortness of breath.  Endocrine:   Patient denies excessive thirst.  Musculoskeletal:   Patient denies back pain and joint pain.  Neurological:   Patient denies headaches and dizziness.  Psychologic:   Patient denies depression and anxiety.   VITAL SIGNS: None   MULTI-SYSTEM PHYSICAL EXAMINATION:    Constitutional: Well-nourished. No physical deformities. Normally developed. Good grooming.  Respiratory: No labored breathing, no use of accessory muscles.   Cardiovascular: Normal temperature, normal extremity pulses, no swelling, no varicosities.     Complexity of Data:  Source Of History:  Patient  Records Review:   Pathology Reports  Urine Test Review:   Urinalysis   08/16/23  PSA  Total PSA 6.15 ng/mL  Free PSA 0.90 ng/mL  % Free PSA 15 % PSA    PROCEDURES:          Urinalysis Dipstick Dipstick Cont'd  Color: Yellow Bilirubin: Neg mg/dL  Appearance: Clear Ketones: Neg mg/dL  Specific Gravity: 8.979 Blood: Neg ery/uL  pH: 6.0 Protein: Trace mg/dL  Glucose:  Neg mg/dL Urobilinogen: 0.2 mg/dL    Nitrites: Neg    Leukocyte Esterase: Neg leu/uL    ASSESSMENT:      ICD-10 Details  1 GU:   Prostate Cancer - C61 Chronic, Stable   PLAN:           Orders Labs Urine Culture, Hgb-A1c, CBC, BMP          Document Letter(s):  Created for Patient: Clinical Summary         Notes:   Prostate cancer: Grade group 3 prostate cancer with low PSA unfavorable intermediate risk prostate cancer. PSMA scan shows 1 area in the rib that  is equivocal. Planning for radical prostatectomy.   We discussed risk benefits alternatives procedure including bleeding infection demonstrating structures possible urethral leak possible urethral stricture possible lymphatic leak possible erectile dysfunction possible urinary incontinence. Patient voiced understanding consent was obtained.   Patient has been seen by PT.  Ucx negative  Plan for RALP today

## 2024-01-08 LAB — TYPE AND SCREEN
ABO/RH(D): AB POS
Antibody Screen: NEGATIVE

## 2024-01-09 ENCOUNTER — Other Ambulatory Visit: Payer: Self-pay

## 2024-01-09 ENCOUNTER — Encounter (HOSPITAL_COMMUNITY): Payer: Self-pay | Admitting: Urology

## 2024-01-09 ENCOUNTER — Ambulatory Visit (HOSPITAL_COMMUNITY)
Admission: RE | Admit: 2024-01-09 | Discharge: 2024-01-10 | Disposition: A | Discharge status: Home / Self Care | Attending: Urology | Admitting: Urology

## 2024-01-09 ENCOUNTER — Ambulatory Visit (HOSPITAL_COMMUNITY): Payer: Self-pay | Admitting: Medical

## 2024-01-09 ENCOUNTER — Ambulatory Visit (HOSPITAL_COMMUNITY): Payer: Self-pay | Admitting: Anesthesiology

## 2024-01-09 ENCOUNTER — Encounter (HOSPITAL_COMMUNITY)
Admission: RE | Disposition: A | Discharge status: Home / Self Care | Payer: Self-pay | Source: Home / Self Care | Attending: Urology

## 2024-01-09 DIAGNOSIS — E119 Type 2 diabetes mellitus without complications: Secondary | ICD-10-CM | POA: Insufficient documentation

## 2024-01-09 DIAGNOSIS — G4730 Sleep apnea, unspecified: Secondary | ICD-10-CM | POA: Diagnosis not present

## 2024-01-09 DIAGNOSIS — G4733 Obstructive sleep apnea (adult) (pediatric): Secondary | ICD-10-CM | POA: Diagnosis not present

## 2024-01-09 DIAGNOSIS — F418 Other specified anxiety disorders: Secondary | ICD-10-CM

## 2024-01-09 DIAGNOSIS — Z7984 Long term (current) use of oral hypoglycemic drugs: Secondary | ICD-10-CM | POA: Diagnosis not present

## 2024-01-09 DIAGNOSIS — C61 Malignant neoplasm of prostate: Secondary | ICD-10-CM | POA: Insufficient documentation

## 2024-01-09 HISTORY — PX: ROBOT ASSISTED LAPAROSCOPIC RADICAL PROSTATECTOMY: SHX5141

## 2024-01-09 LAB — GLUCOSE, CAPILLARY
Glucose-Capillary: 137 mg/dL — ABNORMAL HIGH (ref 70–99)
Glucose-Capillary: 146 mg/dL — ABNORMAL HIGH (ref 70–99)
Glucose-Capillary: 213 mg/dL — ABNORMAL HIGH (ref 70–99)
Glucose-Capillary: 180 mg/dL — ABNORMAL HIGH (ref 70–99)
Glucose-Capillary: 189 mg/dL — ABNORMAL HIGH (ref 70–99)
Glucose-Capillary: 177 mg/dL — ABNORMAL HIGH (ref 70–99)

## 2024-01-09 LAB — CBC
HCT: 40.2 % (ref 39.0–52.0)
Hemoglobin: 14.1 g/dL (ref 13.0–17.0)
MCH: 31.9 pg (ref 26.0–34.0)
MCHC: 35.1 g/dL (ref 30.0–36.0)
MCV: 91 fL (ref 80.0–100.0)
Platelets: 135 K/uL — ABNORMAL LOW (ref 150–400)
RBC: 4.42 MIL/uL (ref 4.22–5.81)
RDW: 11.6 % (ref 11.5–15.5)
WBC: 11 K/uL — ABNORMAL HIGH (ref 4.0–10.5)
nRBC: 0 % (ref 0.0–0.2)

## 2024-01-09 LAB — BASIC METABOLIC PANEL WITH GFR
Anion gap: 9 (ref 5–15)
BUN: 13 mg/dL (ref 8–23)
CO2: 24 mmol/L (ref 22–32)
Calcium: 8.6 mg/dL — ABNORMAL LOW (ref 8.9–10.3)
Chloride: 100 mmol/L (ref 98–111)
Creatinine, Ser: 1.2 mg/dL (ref 0.61–1.24)
GFR, Estimated: >60 mL/min (ref >60)
Glucose, Bld: 195 mg/dL — ABNORMAL HIGH (ref 70–99)
Potassium: 4 mmol/L (ref 3.5–5.1)
Sodium: 133 mmol/L — ABNORMAL LOW (ref 135–145)

## 2024-01-09 LAB — ABO/RH: ABO/RH(D): AB POS

## 2024-01-09 SURGERY — PROSTATECTOMY, RADICAL, ROBOT-ASSISTED, LAPAROSCOPIC
Anesthesia: General | Site: Pelvis

## 2024-01-09 MED ORDER — DEXAMETHASONE SODIUM PHOSPHATE 10 MG/ML IJ SOLN
INTRAMUSCULAR | Status: AC
  Filled 2024-01-09: qty 1

## 2024-01-09 MED ORDER — CEFAZOLIN SODIUM-DEXTROSE 1-4 GM/50ML-% IV SOLN
1.0000 g | Freq: Three times a day (TID) | INTRAVENOUS | Status: AC
  Administered 2024-01-09 – 2024-01-10 (×2): 1 g via INTRAVENOUS
  Filled 2024-01-09 (×2): qty 50

## 2024-01-09 MED ORDER — BUPIVACAINE HCL (PF) 0.25 % IJ SOLN
INTRAMUSCULAR | Status: AC
Start: 2024-01-09 — End: 2024-01-09
  Filled 2024-01-09: qty 30

## 2024-01-09 MED ORDER — POLYETHYLENE GLYCOL 3350 17 G PO PACK
17.0000 g | PACK | Freq: Every day | ORAL | Status: DC
  Filled 2024-01-09 (×2): qty 1

## 2024-01-09 MED ORDER — LACTATED RINGERS IV SOLN: INTRAVENOUS | Status: DC

## 2024-01-09 MED ORDER — HYDROMORPHONE HCL 1 MG/ML IJ SOLN
0.2500 mg | INTRAMUSCULAR | Status: DC | PRN
  Administered 2024-01-09 (×4): 0.5 mg via INTRAVENOUS

## 2024-01-09 MED ORDER — HYDROMORPHONE HCL 1 MG/ML IJ SOLN
0.5000 mg | INTRAMUSCULAR | Status: DC | PRN
  Administered 2024-01-09: 1 mg via INTRAVENOUS
  Filled 2024-01-09: qty 1

## 2024-01-09 MED ORDER — OXYCODONE HCL 5 MG PO TABS: 5.0000 mg | ORAL_TABLET | Freq: Once | ORAL | Status: DC | PRN

## 2024-01-09 MED ORDER — ONDANSETRON HCL 4 MG/2ML IJ SOLN
4.0000 mg | INTRAMUSCULAR | Status: DC | PRN
Start: 2024-01-09 — End: 2024-01-10

## 2024-01-09 MED ORDER — PHENYLEPHRINE 80 MCG/ML (10ML) SYRINGE FOR IV PUSH (FOR BLOOD PRESSURE SUPPORT)
PREFILLED_SYRINGE | INTRAVENOUS | Status: DC | PRN
  Administered 2024-01-09: 100 ug via INTRAVENOUS
  Administered 2024-01-09 (×2): 80 ug via INTRAVENOUS
  Administered 2024-01-09: 40 ug via INTRAVENOUS
  Administered 2024-01-09: 80 ug via INTRAVENOUS

## 2024-01-09 MED ORDER — FENTANYL CITRATE (PF) 250 MCG/5ML IJ SOLN
INTRAMUSCULAR | Status: AC
  Filled 2024-01-09: qty 5

## 2024-01-09 MED ORDER — ROCURONIUM BROMIDE 10 MG/ML (PF) SYRINGE
PREFILLED_SYRINGE | INTRAVENOUS | Status: AC
  Filled 2024-01-09: qty 10

## 2024-01-09 MED ORDER — INSULIN ASPART 100 UNIT/ML IJ SOLN
4.0000 [IU] | Freq: Three times a day (TID) | INTRAMUSCULAR | Status: DC
  Administered 2024-01-10 (×2): 4 [IU] via SUBCUTANEOUS

## 2024-01-09 MED ORDER — DULOXETINE HCL 20 MG PO CPEP
40.0000 mg | ORAL_CAPSULE | Freq: Every day | ORAL | Status: DC
  Administered 2024-01-10: 40 mg via ORAL
  Filled 2024-01-09: qty 2

## 2024-01-09 MED ORDER — BUPIVACAINE LIPOSOME 1.3 % IJ SUSP
INTRAMUSCULAR | Status: DC | PRN
  Administered 2024-01-09: 20 mL

## 2024-01-09 MED ORDER — METHOCARBAMOL 500 MG PO TABS
1000.0000 mg | ORAL_TABLET | Freq: Four times a day (QID) | ORAL | Status: DC
  Administered 2024-01-09 – 2024-01-10 (×3): 1000 mg via ORAL
  Filled 2024-01-09 (×3): qty 2

## 2024-01-09 MED ORDER — ACETAMINOPHEN 500 MG PO TABS
1000.0000 mg | ORAL_TABLET | Freq: Four times a day (QID) | ORAL | Status: AC
  Administered 2024-01-09 – 2024-01-10 (×4): 1000 mg via ORAL
  Filled 2024-01-09 (×4): qty 2

## 2024-01-09 MED ORDER — INSULIN ASPART 100 UNIT/ML IJ SOLN: 0.0000 [IU] | Freq: Every day | INTRAMUSCULAR | Status: DC

## 2024-01-09 MED ORDER — HYDROCODONE-ACETAMINOPHEN 5-325 MG PO TABS: 1.0000 | ORAL_TABLET | Freq: Four times a day (QID) | ORAL | 0 refills | Status: AC | PRN

## 2024-01-09 MED ORDER — CHLORHEXIDINE GLUCONATE 0.12 % MT SOLN
15.0000 mL | Freq: Once | OROMUCOSAL | Status: AC
  Administered 2024-01-09: 15 mL via OROMUCOSAL

## 2024-01-09 MED ORDER — ROSUVASTATIN CALCIUM 20 MG PO TABS
40.0000 mg | ORAL_TABLET | Freq: Every day | ORAL | Status: DC
  Administered 2024-01-10: 40 mg via ORAL
  Filled 2024-01-09: qty 2

## 2024-01-09 MED ORDER — KETAMINE HCL 50 MG/5ML IJ SOSY
PREFILLED_SYRINGE | INTRAMUSCULAR | Status: DC | PRN
  Administered 2024-01-09: 20 mg via INTRAVENOUS
  Administered 2024-01-09: 10 mg via INTRAVENOUS
  Administered 2024-01-09: 20 mg via INTRAVENOUS

## 2024-01-09 MED ORDER — KETAMINE HCL 50 MG/5ML IJ SOSY
PREFILLED_SYRINGE | INTRAMUSCULAR | Status: AC
Start: 2024-01-09 — End: 2024-01-09
  Filled 2024-01-09: qty 5

## 2024-01-09 MED ORDER — MEPERIDINE HCL 25 MG/ML IJ SOLN: 6.2500 mg | INTRAMUSCULAR | Status: DC | PRN

## 2024-01-09 MED ORDER — OXYCODONE HCL 5 MG PO TABS
5.0000 mg | ORAL_TABLET | ORAL | Status: DC | PRN
  Administered 2024-01-10 (×3): 5 mg via ORAL
  Filled 2024-01-09 (×3): qty 1

## 2024-01-09 MED ORDER — ONDANSETRON HCL 4 MG/2ML IJ SOLN
INTRAMUSCULAR | Status: AC
  Filled 2024-01-09: qty 2

## 2024-01-09 MED ORDER — PHENYLEPHRINE 80 MCG/ML (10ML) SYRINGE FOR IV PUSH (FOR BLOOD PRESSURE SUPPORT)
PREFILLED_SYRINGE | INTRAVENOUS | Status: AC
  Filled 2024-01-09: qty 10

## 2024-01-09 MED ORDER — INSULIN ASPART 100 UNIT/ML IJ SOLN
0.0000 [IU] | Freq: Three times a day (TID) | INTRAMUSCULAR | Status: DC
  Administered 2024-01-10 (×2): 2 [IU] via SUBCUTANEOUS

## 2024-01-09 MED ORDER — DIPHENHYDRAMINE HCL 50 MG/ML IJ SOLN: 12.5000 mg | Freq: Four times a day (QID) | INTRAMUSCULAR | Status: DC | PRN

## 2024-01-09 MED ORDER — MIDAZOLAM HCL 2 MG/2ML IJ SOLN
INTRAMUSCULAR | Status: AC
  Filled 2024-01-09: qty 2

## 2024-01-09 MED ORDER — HEPARIN SODIUM (PORCINE) 5000 UNIT/ML IJ SOLN
5000.0000 [IU] | Freq: Once | INTRAMUSCULAR | Status: AC
  Administered 2024-01-09: 5000 [IU] via SUBCUTANEOUS
  Filled 2024-01-09: qty 1

## 2024-01-09 MED ORDER — POLYETHYLENE GLYCOL 3350 17 GM/SCOOP PO POWD
ORAL | Status: AC
Start: 2024-01-09 — End: ?

## 2024-01-09 MED ORDER — SULFAMETHOXAZOLE-TRIMETHOPRIM 800-160 MG PO TABS: 1.0000 | ORAL_TABLET | Freq: Two times a day (BID) | ORAL | 0 refills | Status: AC

## 2024-01-09 MED ORDER — MELATONIN 3 MG PO TABS
3.0000 mg | ORAL_TABLET | Freq: Every day | ORAL | Status: DC
  Administered 2024-01-09: 3 mg via ORAL
  Filled 2024-01-09: qty 1

## 2024-01-09 MED ORDER — SODIUM CHLORIDE 0.9 % IV SOLN: INTRAVENOUS | Status: DC

## 2024-01-09 MED ORDER — FENTANYL CITRATE (PF) 100 MCG/2ML IJ SOLN
INTRAMUSCULAR | Status: DC | PRN
  Administered 2024-01-09 (×3): 50 ug via INTRAVENOUS
  Administered 2024-01-09: 100 ug via INTRAVENOUS

## 2024-01-09 MED ORDER — SUGAMMADEX SODIUM 200 MG/2ML IV SOLN
INTRAVENOUS | Status: DC | PRN
  Administered 2024-01-09: 350 mg via INTRAVENOUS

## 2024-01-09 MED ORDER — LIDOCAINE HCL (PF) 2 % IJ SOLN
INTRAMUSCULAR | Status: AC
  Filled 2024-01-09: qty 5

## 2024-01-09 MED ORDER — LIDOCAINE HCL (CARDIAC) PF 100 MG/5ML IV SOSY
PREFILLED_SYRINGE | INTRAVENOUS | Status: DC | PRN
  Administered 2024-01-09: 20 mg via INTRAVENOUS

## 2024-01-09 MED ORDER — ORAL CARE MOUTH RINSE: 15.0000 mL | Freq: Once | OROMUCOSAL | Status: AC

## 2024-01-09 MED ORDER — HYDROMORPHONE HCL 1 MG/ML IJ SOLN
INTRAMUSCULAR | Status: AC
Start: 2024-01-09 — End: 2024-01-09
  Filled 2024-01-09: qty 1

## 2024-01-09 MED ORDER — SENNOSIDES-DOCUSATE SODIUM 8.6-50 MG PO TABS
2.0000 | ORAL_TABLET | Freq: Every day | ORAL | Status: DC
  Administered 2024-01-09: 2 via ORAL
  Filled 2024-01-09: qty 2

## 2024-01-09 MED ORDER — MIDAZOLAM HCL 5 MG/5ML IJ SOLN
INTRAMUSCULAR | Status: DC | PRN
  Administered 2024-01-09: 2 mg via INTRAVENOUS

## 2024-01-09 MED ORDER — SUGAMMADEX SODIUM 200 MG/2ML IV SOLN
INTRAVENOUS | Status: AC
Start: 2024-01-09 — End: 2024-01-09
  Filled 2024-01-09: qty 4

## 2024-01-09 MED ORDER — CEFAZOLIN SODIUM-DEXTROSE 2-4 GM/100ML-% IV SOLN
2.0000 g | INTRAVENOUS | Status: AC
  Administered 2024-01-09: 2 g via INTRAVENOUS
  Filled 2024-01-09: qty 100

## 2024-01-09 MED ORDER — SODIUM CHLORIDE 0.9 % IV BOLUS
1000.0000 mL | Freq: Once | INTRAVENOUS | Status: AC
  Administered 2024-01-09: 1000 mL via INTRAVENOUS

## 2024-01-09 MED ORDER — METHOCARBAMOL 500 MG PO TABS: 500.0000 mg | ORAL_TABLET | Freq: Three times a day (TID) | ORAL | 0 refills | Status: AC

## 2024-01-09 MED ORDER — HYDROMORPHONE HCL 1 MG/ML IJ SOLN
INTRAMUSCULAR | Status: AC
  Filled 2024-01-09: qty 1

## 2024-01-09 MED ORDER — PROPOFOL 10 MG/ML IV BOLUS
INTRAVENOUS | Status: AC
  Filled 2024-01-09: qty 20

## 2024-01-09 MED ORDER — HYOSCYAMINE SULFATE 0.125 MG SL SUBL: 0.1250 mg | SUBLINGUAL_TABLET | SUBLINGUAL | Status: DC | PRN

## 2024-01-09 MED ORDER — BUPIVACAINE LIPOSOME 1.3 % IJ SUSP
INTRAMUSCULAR | Status: AC
  Filled 2024-01-09: qty 20

## 2024-01-09 MED ORDER — LACTATED RINGERS IR SOLN
Status: DC | PRN
  Administered 2024-01-09: 1000 mL

## 2024-01-09 MED ORDER — BUPIVACAINE HCL (PF) 0.25 % IJ SOLN
INTRAMUSCULAR | Status: DC | PRN
  Administered 2024-01-09: 17 mL
  Administered 2024-01-09: 13 mL

## 2024-01-09 MED ORDER — OXYCODONE HCL 5 MG/5ML PO SOLN: 5.0000 mg | Freq: Once | ORAL | Status: DC | PRN

## 2024-01-09 MED ORDER — DIPHENHYDRAMINE HCL 12.5 MG/5ML PO ELIX: 12.5000 mg | ORAL_SOLUTION | Freq: Four times a day (QID) | ORAL | Status: DC | PRN

## 2024-01-09 MED ORDER — ACETAMINOPHEN 500 MG PO TABS
1000.0000 mg | ORAL_TABLET | Freq: Once | ORAL | Status: AC
  Administered 2024-01-09: 1000 mg via ORAL
  Filled 2024-01-09: qty 2

## 2024-01-09 MED ORDER — INSULIN ASPART 100 UNIT/ML IJ SOLN
0.0000 [IU] | INTRAMUSCULAR | Status: AC | PRN
  Administered 2024-01-09: 4 [IU] via SUBCUTANEOUS
  Administered 2024-01-09: 2 [IU] via SUBCUTANEOUS

## 2024-01-09 MED ORDER — ONDANSETRON HCL 4 MG/2ML IJ SOLN
INTRAMUSCULAR | Status: DC | PRN
  Administered 2024-01-09: 4 mg via INTRAVENOUS

## 2024-01-09 MED ORDER — ROCURONIUM BROMIDE 100 MG/10ML IV SOLN
INTRAVENOUS | Status: DC | PRN
Start: 2024-01-09 — End: 2024-01-09
  Administered 2024-01-09 (×2): 30 mg via INTRAVENOUS
  Administered 2024-01-09: 20 mg via INTRAVENOUS
  Administered 2024-01-09: 10 mg via INTRAVENOUS
  Administered 2024-01-09: 20 mg via INTRAVENOUS
  Administered 2024-01-09: 70 mg via INTRAVENOUS

## 2024-01-09 MED ORDER — MIDAZOLAM HCL 2 MG/2ML IJ SOLN: 0.5000 mg | Freq: Once | INTRAMUSCULAR | Status: DC | PRN

## 2024-01-09 MED ORDER — PROPOFOL 10 MG/ML IV BOLUS
INTRAVENOUS | Status: DC | PRN
  Administered 2024-01-09: 150 mg via INTRAVENOUS
  Administered 2024-01-09: 30 mg via INTRAVENOUS

## 2024-01-09 MED ORDER — DEXMEDETOMIDINE HCL IN NACL 80 MCG/20ML IV SOLN
INTRAVENOUS | Status: AC
  Filled 2024-01-09: qty 20

## 2024-01-09 MED ORDER — DEXAMETHASONE SODIUM PHOSPHATE 10 MG/ML IJ SOLN
INTRAMUSCULAR | Status: DC | PRN
Start: 2024-01-09 — End: 2024-01-09
  Administered 2024-01-09: 5 mg via INTRAVENOUS

## 2024-01-09 SURGICAL SUPPLY — 61 items
APPLICATOR COTTON TIP 6 STRL (MISCELLANEOUS) ×2 IMPLANT
APPLICATOR SURGIFLO ENDO (HEMOSTASIS) ×2 IMPLANT
BAG COUNTER SPONGE SURGICOUNT (BAG) IMPLANT
CATH FOLEY 2WAY SLVR 5CC 18FR (CATHETERS) ×2 IMPLANT
CATH ROBINSON RED A/P 16FR (CATHETERS) ×2 IMPLANT
CATH TIEMANN FOLEY 18FR 5CC (CATHETERS) ×2 IMPLANT
CHLORAPREP W/TINT 26 (MISCELLANEOUS) ×2 IMPLANT
CLIP LIGATING HEM O LOK PURPLE (MISCELLANEOUS) ×4 IMPLANT
COVER SURGICAL LIGHT HANDLE (MISCELLANEOUS) ×2 IMPLANT
COVER TIP SHEARS 8 DVNC (MISCELLANEOUS) ×2 IMPLANT
CUTTER ECHEON FLEX ENDO 45 340 (ENDOMECHANICALS) ×2 IMPLANT
DERMABOND ADVANCED .7 DNX12 (GAUZE/BANDAGES/DRESSINGS) ×4 IMPLANT
DRAPE ARM DVNC X/XI (DISPOSABLE) ×8 IMPLANT
DRAPE COLUMN DVNC XI (DISPOSABLE) ×2 IMPLANT
DRAPE SURG IRRIG POUCH 19X23 (DRAPES) ×2 IMPLANT
DRIVER NDL LRG 8 DVNC XI (INSTRUMENTS) ×4 IMPLANT
DRIVER NDLE LRG 8 DVNC XI (INSTRUMENTS) ×4 IMPLANT
DRSG TEGADERM 4X4.75 (GAUZE/BANDAGES/DRESSINGS) ×2 IMPLANT
ELECT PENCIL ROCKER SW 15FT (MISCELLANEOUS) ×2 IMPLANT
ELECT REM PT RETURN 15FT ADLT (MISCELLANEOUS) ×2 IMPLANT
FORCEPS BPLR LNG DVNC XI (INSTRUMENTS) ×2 IMPLANT
FORCEPS PROGRASP DVNC XI (FORCEP) ×2 IMPLANT
GAUZE 4X4 16PLY ~~LOC~~+RFID DBL (SPONGE) IMPLANT
GAUZE SPONGE 2X2 8PLY STRL LF (GAUZE/BANDAGES/DRESSINGS) IMPLANT
GAUZE SPONGE 4X4 12PLY STRL (GAUZE/BANDAGES/DRESSINGS) ×2 IMPLANT
GLOVE BIO SURGEON STRL SZ 6.5 (GLOVE) ×2 IMPLANT
GLOVE BIO SURGEON STRL SZ8 (GLOVE) ×4 IMPLANT
GLOVE BIOGEL PI IND STRL 8.5 (GLOVE) ×4 IMPLANT
GOWN STRL REUS W/ TWL XL LVL3 (GOWN DISPOSABLE) ×4 IMPLANT
GOWN STRL SURGICAL XL XLNG (GOWN DISPOSABLE) ×2 IMPLANT
HEMOSTAT SURGICEL 2X4 FIBR (HEMOSTASIS) ×2 IMPLANT
HOLDER FOLEY CATH W/STRAP (MISCELLANEOUS) ×2 IMPLANT
IRRIGATION SUCT STRKRFLW 2 WTP (MISCELLANEOUS) ×2 IMPLANT
IV LACTATED RINGERS 1000ML (IV SOLUTION) IMPLANT
KIT TURNOVER KIT A (KITS) ×2 IMPLANT
MARKER SKIN DUAL TIP RULER LAB (MISCELLANEOUS) IMPLANT
NDL INSUFFLATION 14GA 120MM (NEEDLE) ×2 IMPLANT
NEEDLE INSUFFLATION 14GA 120MM (NEEDLE) ×2 IMPLANT
PACK ROBOT UROLOGY CUSTOM (CUSTOM PROCEDURE TRAY) ×2 IMPLANT
PAD POSITIONING PINK XL (MISCELLANEOUS) ×2 IMPLANT
PLUG CATH AND CAP STRL 200 (CATHETERS) IMPLANT
PORT ACCESS TROCAR AIRSEAL 12 (TROCAR) ×2 IMPLANT
RELOAD STAPLE 45 4.1 GRN THCK (STAPLE) ×2 IMPLANT
SCISSORS MNPLR CVD DVNC XI (INSTRUMENTS) ×2 IMPLANT
SEAL UNIV 5-12 XI (MISCELLANEOUS) ×8 IMPLANT
SET TRI-LUMEN FLTR TB AIRSEAL (TUBING) ×2 IMPLANT
SOL PREP POV-IOD 4OZ 10% (MISCELLANEOUS) ×2 IMPLANT
SOLUTION ELECTROSURG ANTI STCK (MISCELLANEOUS) ×2 IMPLANT
SPIKE FLUID TRANSFER (MISCELLANEOUS) ×2 IMPLANT
SURGIFLO W/THROMBIN 8M KIT (HEMOSTASIS) ×2 IMPLANT
SUT ETHILON 3 0 PS 1 (SUTURE) ×2 IMPLANT
SUT MNCRL AB 4-0 PS2 18 (SUTURE) ×4 IMPLANT
SUT PDS AB 0 CT1 36 (SUTURE) ×4 IMPLANT
SUT VIC AB 0 CT1 27XBRD ANTBC (SUTURE) ×2 IMPLANT
SUT VIC AB 2-0 SH 27X BRD (SUTURE) ×4 IMPLANT
SUT VIC AB 3-0 SH 27XBRD (SUTURE) IMPLANT
SUT VICRYL 0 UR6 27IN ABS (SUTURE) IMPLANT
SUT VLOC 3-0 9IN GRN (SUTURE) ×2 IMPLANT
SUTURE VLOC BRB 180 ABS3/0GR12 (SUTURE) ×4 IMPLANT
SYRINGE TOOMEY IRRIG 70ML (MISCELLANEOUS) IMPLANT
WATER STERILE IRR 1000ML POUR (IV SOLUTION) ×2 IMPLANT

## 2024-01-09 NOTE — Transfer of Care (Signed)
 Immediate Anesthesia Transfer of Care Note  Patient: Ricardo Lutz  Procedure(s) Performed: PROSTATECTOMY, RADICAL, ROBOT-ASSISTED, LAPAROSCOPIC (Pelvis) LYMPHADENECTOMY, PELVIS, ROBOT-ASSISTED (Bilateral: Pelvis)  Patient Location: PACU  Anesthesia Type:General  Level of Consciousness: awake  Airway & Oxygen Therapy: Patient Spontanous Breathing and Patient connected to nasal cannula oxygen  Post-op Assessment: Report given to RN and Post -op Vital signs reviewed and stable  Post vital signs: Reviewed and stable  Last Vitals:  Vitals Value Taken Time  BP 152/102 01/09/24 12:25  Temp    Pulse 92 01/09/24 12:28  Resp 13 01/09/24 12:28  SpO2 94 % 01/09/24 12:28  Vitals shown include unfiled device data.  Last Pain:  Vitals:   01/09/24 0536  TempSrc: Oral         Complications: No notable events documented.

## 2024-01-09 NOTE — Progress Notes (Signed)
 Post-op note  Subjective: The patient is doing well.  No complaints.  Objective: Vital signs in last 24 hours: Temp:  [97.6 F (36.4 C)-98.3 F (36.8 C)] 97.8 F (36.6 C) (07/29 1330) Pulse Rate:  [82-95] 94 (07/29 1345) Resp:  [11-16] 11 (07/29 1345) BP: (133-152)/(78-102) 134/83 (07/29 1345) SpO2:  [100 %] 100 % (07/29 1345) Weight:  [88.1 kg] 88.1 kg (07/29 0553)  Intake/Output from previous day: No intake/output data recorded. Intake/Output this shift: Total I/O In: 1300 [I.V.:1200; IV Piggyback:100] Out: 250 [Blood:250]  Physical Exam:  General: Alert and oriented. Abdomen: Soft, Nondistended. Incisions: Clean and dry.  Lab Results: Recent Labs    01/09/24 1250  HGB 14.1  HCT 40.2    Assessment/Plan: POD#0   1) Continue to monitor 2) Labs WNL   Ricardo Pea, MD    LOS: 0 days   Ricardo Lutz 01/09/2024, 2:09 PM

## 2024-01-09 NOTE — Anesthesia Postprocedure Evaluation (Signed)
 Anesthesia Post Note  Patient: Ricardo Lutz  Procedure(s) Performed: PROSTATECTOMY, RADICAL, ROBOT-ASSISTED, LAPAROSCOPIC (Pelvis) LYMPHADENECTOMY, PELVIS, ROBOT-ASSISTED (Bilateral: Pelvis)     Patient location during evaluation: PACU Anesthesia Type: General Level of consciousness: sedated, oriented and patient cooperative Pain management: pain level controlled Vital Signs Assessment: post-procedure vital signs reviewed and stable Respiratory status: spontaneous breathing, nonlabored ventilation, respiratory function stable and patient connected to nasal cannula oxygen Cardiovascular status: blood pressure returned to baseline and stable Postop Assessment: no apparent nausea or vomiting Anesthetic complications: no   No notable events documented.  Last Vitals:  Vitals:   01/09/24 1245 01/09/24 1300  BP: 133/79 138/84  Pulse: 87 84  Resp: 14 12  Temp:    SpO2: 100% 100%    Last Pain:  Vitals:   01/09/24 1311  TempSrc:   PainSc: Asleep                 Jackelin Correia,E. Kamarri Fischetti

## 2024-01-09 NOTE — Op Note (Signed)
 Operative Note  Preoperative diagnosis:  1.  Localized prostate cancer  Postoperative diagnosis: 1.  Localized prostate cancer  Procedure(s): 1.  Robotic assisted laparoscopic radical prostatectomy (right sided nerve sparing) 2.  Robotic assisted laparoscopic bilateral pelvic lymph node dissection  Surgeon: Steffan Pea, MD  Assistants:   Alan Hammonds, PA  An assistant was required for this surgical procedure.  The duties of the assistant included but were not limited to suctioning, passing suture, camera manipulation, retraction.  This procedure would not be able to be performed without an Geophysicist/field seismologist.     Anesthesia:  General  Complications:  None  EBL: 250 cc  Specimens: 1.  Prostate with seminal vesicles 2.  Periprostatic fat 3. Bilateral pelvic lymph nodes  Drains/Catheters: 1.  18 French Foley catheter  Intraoperative findings:   Approximately 26 g prostate Posterior prostate removed and obvious very adherent Initial leak test demonstrated leak on patient's right posterior anastomosis.  Leak repaired with 3-0 Vicryl on retest no leak appreciated. Left-sided nerves not spared due to disease grade.   Indication:  Ricardo Lutz is a 63 y.o. malewho initially presented with an elevated PSA.  Prostate biopsy showed Gleason 4+3 prostate cancer involving the left side of the prostate.  Treatment options were discussed with him at length and he chose robotic assisted laparoscopic radical prostatectomy.  He has unfavorable intermediate risk prostate cancer and the plan is for right nerve sparing.  Bilateral pelvic lymph node dissection was plan due to his risk stratification.  Description of procedure: The indications, alternatives, benefits, and risks were discussed with the patient and informed symptoms obtained.  The patient was brought to the operating room table, positioned supine and secured to the bed with a safety strap.  All pressure points were carefully  padded and pneumatic compression devices were placed on lower extremities.  After the administration of intravenous antibiotics, subcutaneous heparin  preoperatively  and general endotracheal anesthesia, the patient was repositioned in the dorsal lithotomy position using well-padded Allen stirrups.  The arms were carefully tucked at the patient's side and secured with padding.  The chest was secured in place with foam padding and cloth tape and the table was positioned in approximately 30 degree Trendelenburg.  The patient's abdomen, genitalia, and upper thighs were prepped and draped in the standard sterile manner.  A time was completed, verifying the correct patient, surgical procedure and positioning prior to beginning the procedure.  An 58 French urethral catheter was inserted to drain the bladder.  Incision was made 1cm above the umbilicus. Dissection was taken down to the fascia.  Pneumoperitoneum was introduced by placing a Veress needle into the abdomen superior to the umbilicus and insufflated with CO2 to a pressure of 15 mmHg. A 5 mm visiport was placed 1 cm of the umbilicus..  The 0 degree camera was then passed under direct visualization.  The abdominal cavity was examined for any sign of injury, adhesions, and identification of anatomic landmarks.  The remainder of the trochars were placed which included 2 separate 8 millimeter robotic trochars which were placed 9 cm laterally and inferiorly to the initially placed camera trocar.  A 12 mm trocar was placed 8 mm lateral to the right robotic trocar.  A separate 8 mm robotic trocar was placed 8 cm lateral to the previously placed left robotic trocar on the left side.  A 5 mm trocar was placed to the right and well above the umbilicus which approximately 10 and 12 cm away from the right-sided  trochars.  The robot was then docked.  I placed monopolar scissors in the right hand, a fenestrated bipolar in the left hand and a prograsp in the fourth arm.  The  sigmoid colon was then released from its lateral attachments setting to be retracted cranially.  Once this dissection was completed.  A 5 cm incision was made 1 cm above the peritoneal reflection dissection was taken through the tissue until did not manage was encountered.  The vas deferens was identified in the right dissected completely and freed around the tip of the central vesicle.  The vas was then ligated and the single vessel was dissected out.  The same exact dissection was taken down the left side as well upon dissection was taken through an obvious towards the prostatic apex.  The urachus and median umbilical ligament was divided and we developed the space of Retzius down to the pubic bone.  I divided the parietal peritoneum laterally up to the vas deferens on each side.  Using the prograsp forcep to provide cranial traction on the urachus, the prostate was then defatted above the prostatic vesicle junction, and the superficial dorsal venous complex was coagulated with bipolar and divided.  We submitted the periprostatic fat for pathological specimen.  The endopelvic fascia was sharply opened bilaterally and the levator muscle fibers were swept posterior laterally allowing for visualization of the deep dorsal venous complex and apex of the prostate.  The puboprostatic ligaments were sharply divided and care was taken to preserve the dorsal venous complex.  The dorsal venous complex was then stapled using a 45 mm endovascular stapler.   0 degree lens was kept in place. I identified the bladder neck by pulling a Foley catheter.  The fourth arm was applying cranial traction on the urachus.  I divided the anterior bladder neck musculature until I found the anterior bladder neck mucosa which was then incised. I identified the Foley catheter within, the balloon was deflated, we pulled the Foley catheter out into the operating field.  The assistant then used a grasper to apply traction to the Foley catheter  and the surgical tech then placed a Holland on the catheter near the penis for optimal retraction.  I then divided the lateral bladder neck mucosa in the posterior bladder neck mucosa.   I was well away from the bilateral ureteral orifices.  I divided the posterior bladder neck musculature until I discovered the longitudinal fibers and kept dissecting until I found the vas deferens.      I divided the Denonvilliers fascia beneath the prostate and developed the prostate off the rectum.  This plane was bluntly developed towards the apex of the prostate.  I then addressed the pedicles, first starting with the right and then moving towards the left. I then did a right nerve spare by dividing the lateral pelvic fascia off of the prostatic capsule laterally.  I then isolated the pedicles of the prostate and used the bipolar cautery to burn the pedicles and then divided the pedicles with cold scissors.  I continued to divide the neurovascular bundles off of the prostate out to the apex of the prostate.  At this point the prostate was essentially freed up except for the urethra.   The left side was not spared and the nerve is not swept off the prostate.  .  The anterior urethra was then sharply divided with cold scissors.  The Foley catheter was then pulled back and we divided the posterior urethral wall.  The specimen was then placed in a Endo Catch bag and then the bag was placed in the upper abdomen out of the way.  I then irrigated the pelvis.  We performed a rectal test by instilling air into the rectal Foley.  The test was negative.  There is no concern for rectal injury.  I then over sewed a few bleeders alongside the pedicles with a 4-0 Vicryl suture on an RB1 needle.  I then performed a bilateral pelvic lymph node dissection by incising the fascia overlying the right external iliac vein, dissecting distally.  I went just distal to the node of Cloquet were replaced clips and then divided the lymphatics.  The  lateral aspect of the dissection was the pelvic sidewall, inferior was the obturator nerve and proximal of the hypogastric vessels.  I placed clips at the proximal aspect and then divided the lymphatics.  The specimen was removed with the scope grasper and sent to pathology.  Grossly, there were no enlarged lymph nodes.  This was performed on both the right and left pelvic lymph nodes.  With good hemostasis confirmed, I then did the posterior reconstruction with a Rocco stitch.  I used a 3-0 Vloc double-armed suture on an RB1 needle.  I passed the sutures through the retrotrigonal fascia beneath the bladder on the right side and then to the rectourethralis 4 throws were done on each side, I ran this from right to left.  And then took the other end of the suture passing just proximal to the posterior bladder neck in the midline into the posterior serrated sphincter and around this from right to left using 3 throws and reapproximated the sutures.  I then completed the urethrovesical anastomosis using a 3-0 Vloc suture on an RB1 needle.  I passed both ends of the suture from the outside in through the bladder neck at the 6 o'clock position.  I passed both through the urethral stump from the inside out and the corresponding position.  I reapproximated the bladder neck to the urethra.  I then ran the left suture on the left side anastomosis to the 9 o'clock position.  And then went back to the right-sided suture around that up the right side of the 12 o'clock position.  I then continued the left suture to the 12 o'clock position. I identified the ureteral orifices and ensured that these were not incorporated with the sutures.  I then placed a new 18 French Foley catheter into the bladder and filled it with 10 cc of sterile water.  I then secured the knot and then passed the suture behind the pubic bone for anterior suspension.  The bladder was irrigated with 200 cc of water.  There was a slight leak right posterior  side of the anastomosis.  The catheter was then removed and a 3-0 Vicryl suture was then thrown in a figure-of-eight over the leak.  18 French catheter was reinserted and a repeat leak test was performed demonstrating no leak..  Hemostasis was excellent.  A 19 [place] Blake drain was placed through the previously placed fourth arm.  We did place a Carter-Thomason 0 vicryl suture through the 12 mm assistant port. The robot was undocked and all the trochars were removed under direct vision.    I enlarged the umbilical trocar site large enough to remove the prostate and closed the fascia with 0 PDS sutures in figure-of-eight interrupted fashion.  All the port sites were irrigated.  Exparel  was injected to the trocar  sites.  The skin was closed with 4-0 Monocryl in a running subcuticular fashion.  Skin glue was applied.  At this point, the patient was extubated and awakened in the operating room and taken to recovery room in stable condition.  There were no immediate complications.  All counts were correct.  Plan: Admit for observation overnight.  Clear liquids tonight.  Regular for breakfast tomorrow.  Anticipate discharge home tomorrow.  Steffan Pea MD [facility]

## 2024-01-09 NOTE — Plan of Care (Signed)
  Problem: Education: Goal: Knowledge of the procedure and recovery process will improve Outcome: Progressing   Problem: Bowel/Gastric: Goal: Gastrointestinal status for postoperative course will improve Outcome: Progressing   Problem: Pain Management: Goal: General experience of comfort will improve Outcome: Progressing   Problem: Skin Integrity: Goal: Demonstration of wound healing without infection will improve Outcome: Progressing   Problem: Urinary Elimination: Goal: Ability to avoid or minimize complications of infection will improve Outcome: Progressing Goal: Ability to achieve and maintain urine output will improve Outcome: Progressing Goal: Home care management will improve Outcome: Progressing   Problem: Education: Goal: Knowledge of General Education information will improve Description: Including pain rating scale, medication(s)/side effects and non-pharmacologic comfort measures Outcome: Progressing   Problem: Health Behavior/Discharge Planning: Goal: Ability to manage health-related needs will improve Outcome: Progressing   Problem: Clinical Measurements: Goal: Ability to maintain clinical measurements within normal limits will improve Outcome: Progressing Goal: Will remain free from infection Outcome: Progressing Goal: Diagnostic test results will improve Outcome: Progressing Goal: Respiratory complications will improve Outcome: Progressing Goal: Cardiovascular complication will be avoided Outcome: Progressing   Problem: Activity: Goal: Risk for activity intolerance will decrease Outcome: Progressing   Problem: Nutrition: Goal: Adequate nutrition will be maintained Outcome: Progressing   Problem: Coping: Goal: Level of anxiety will decrease Outcome: Progressing   Problem: Elimination: Goal: Will not experience complications related to bowel motility Outcome: Progressing Goal: Will not experience complications related to urinary  retention Outcome: Progressing   Problem: Pain Managment: Goal: General experience of comfort will improve and/or be controlled Outcome: Progressing   Problem: Safety: Goal: Ability to remain free from injury will improve Outcome: Progressing   Problem: Skin Integrity: Goal: Risk for impaired skin integrity will decrease Outcome: Progressing   Problem: Education: Goal: Ability to describe self-care measures that may prevent or decrease complications (Diabetes Survival Skills Education) will improve Outcome: Progressing Goal: Individualized Educational Video(s) Outcome: Progressing   Problem: Coping: Goal: Ability to adjust to condition or change in health will improve Outcome: Progressing   Problem: Fluid Volume: Goal: Ability to maintain a balanced intake and output will improve Outcome: Progressing   Problem: Health Behavior/Discharge Planning: Goal: Ability to identify and utilize available resources and services will improve Outcome: Progressing Goal: Ability to manage health-related needs will improve Outcome: Progressing   Problem: Metabolic: Goal: Ability to maintain appropriate glucose levels will improve Outcome: Progressing   Problem: Nutritional: Goal: Maintenance of adequate nutrition will improve Outcome: Progressing Goal: Progress toward achieving an optimal weight will improve Outcome: Progressing   Problem: Skin Integrity: Goal: Risk for impaired skin integrity will decrease Outcome: Progressing   Problem: Tissue Perfusion: Goal: Adequacy of tissue perfusion will improve Outcome: Progressing

## 2024-01-09 NOTE — Discharge Instructions (Signed)

## 2024-01-09 NOTE — Anesthesia Procedure Notes (Signed)
 Procedure Name: Intubation Date/Time: 01/09/2024 7:36 AM  Performed by: Belvie Valri NOVAK, CRNAPre-anesthesia Checklist: Patient identified, Emergency Drugs available, Suction available and Patient being monitored Patient Re-evaluated:Patient Re-evaluated prior to induction Oxygen Delivery Method: Circle System Utilized Preoxygenation: Pre-oxygenation with 100% oxygen Induction Type: IV induction Ventilation: Mask ventilation without difficulty and Oral airway inserted - appropriate to patient size Laryngoscope Size: Mac and 4 Grade View: Grade III Tube type: Oral Tube size: 7.5 mm Number of attempts: 1 Airway Equipment and Method: Stylet and Oral airway Placement Confirmation: ETT inserted through vocal cords under direct vision, positive ETCO2 and breath sounds checked- equal and bilateral Secured at: 24 cm Tube secured with: Tape Dental Injury: Teeth and Oropharynx as per pre-operative assessment

## 2024-01-10 ENCOUNTER — Encounter (HOSPITAL_COMMUNITY): Payer: Self-pay | Admitting: Urology

## 2024-01-10 DIAGNOSIS — C61 Malignant neoplasm of prostate: Secondary | ICD-10-CM | POA: Diagnosis not present

## 2024-01-10 LAB — CBC
HCT: 37.6 % — ABNORMAL LOW (ref 39.0–52.0)
Hemoglobin: 12.6 g/dL — ABNORMAL LOW (ref 13.0–17.0)
MCH: 30.6 pg (ref 26.0–34.0)
MCHC: 33.5 g/dL (ref 30.0–36.0)
MCV: 91.3 fL (ref 80.0–100.0)
Platelets: 128 K/uL — ABNORMAL LOW (ref 150–400)
RBC: 4.12 MIL/uL — ABNORMAL LOW (ref 4.22–5.81)
RDW: 11.7 % (ref 11.5–15.5)
WBC: 10.6 K/uL — ABNORMAL HIGH (ref 4.0–10.5)
nRBC: 0 % (ref 0.0–0.2)

## 2024-01-10 LAB — BASIC METABOLIC PANEL WITH GFR
Anion gap: 6 (ref 5–15)
BUN: 10 mg/dL (ref 8–23)
CO2: 25 mmol/L (ref 22–32)
Calcium: 8.4 mg/dL — ABNORMAL LOW (ref 8.9–10.3)
Chloride: 103 mmol/L (ref 98–111)
Creatinine, Ser: 1.14 mg/dL (ref 0.61–1.24)
GFR, Estimated: >60 mL/min (ref >60)
Glucose, Bld: 139 mg/dL — ABNORMAL HIGH (ref 70–99)
Potassium: 4.5 mmol/L (ref 3.5–5.1)
Sodium: 134 mmol/L — ABNORMAL LOW (ref 135–145)

## 2024-01-10 LAB — CREATININE, FLUID (PLEURAL, PERITONEAL, JP DRAINAGE): Creat, Fluid: 0.9 mg/dL

## 2024-01-10 LAB — GLUCOSE, CAPILLARY
Glucose-Capillary: 128 mg/dL — ABNORMAL HIGH (ref 70–99)
Glucose-Capillary: 133 mg/dL — ABNORMAL HIGH (ref 70–99)

## 2024-01-10 MED ORDER — SENNA 8.6 MG PO TABS: 1.0000 | ORAL_TABLET | Freq: Every day | ORAL | 0 refills | Status: AC

## 2024-01-10 NOTE — Progress Notes (Signed)
   01/10/24 0834  TOC Brief Assessment  Insurance and Status Reviewed  Patient has primary care physician Yes  Home environment has been reviewed Resides in single family home with spouse  Prior level of function: Independent with ADLs at baseline  Prior/Current Home Services No current home services  Social Drivers of Health Review SDOH reviewed no interventions necessary  Readmission risk has been reviewed Yes  Transition of care needs no transition of care needs at this time

## 2024-01-10 NOTE — Progress Notes (Signed)
 1 Day Post-Op Subjective: Ambulating, passing some gas, pain well managed, had some nausea when he stood up last night but this has resolved. Tolerating CLD.   Objective: Vital signs in last 24 hours: Temp:  [97.6 F (36.4 C)-98.2 F (36.8 C)] 98 F (36.7 C) (07/30 0538) Pulse Rate:  [68-95] 68 (07/30 0538) Resp:  [10-20] 16 (07/30 0538) BP: (120-157)/(78-102) 135/90 (07/30 0538) SpO2:  [98 %-100 %] 99 % (07/30 0538)  Intake/Output from previous day: 07/29 0701 - 07/30 0700 In: 2030.2 [I.V.:1830.2; IV Piggyback:200] Out: 2150 [Urine:1750; Drains:150; Blood:250] Intake/Output this shift: No intake/output data recorded.  Physical Exam:  General: Alert and oriented CV: RRR Lungs: Clear Abdomen: Soft, ND, ATTP; incisions from RALP are closed and dry, there does appear to be erythema around all the incisions appears to be reaction from glue. No pain associated at the skin site. Drain in place with serosanguinous output.  GU: catheter in place clear yellow urine.   Lab Results: Recent Labs    01/09/24 1250 01/10/24 0410  HGB 14.1 12.6*  HCT 40.2 37.6*   BMET Recent Labs    01/09/24 1250 01/10/24 0410  NA 133* 134*  K 4.0 4.5  CL 100 103  CO2 24 25  GLUCOSE 195* 139*  BUN 13 10  CREATININE 1.20 1.14  CALCIUM  8.6* 8.4*     Studies/Results: No results found.  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

## 2024-01-10 NOTE — Discharge Summary (Signed)
 Date of admission: 01/09/2024  Date of discharge: 01/10/2024  Admission diagnosis: Prostate Cancer   Discharge diagnosis: Prostate Cancer   Secondary diagnoses:  Patient Active Problem List   Diagnosis Date Noted   Prostate cancer (HCC) 01/09/2024    Procedures performed: Procedure(s): PROSTATECTOMY, RADICAL, ROBOT-ASSISTED, LAPAROSCOPIC LYMPHADENECTOMY, PELVIS, ROBOT-ASSISTED  History and Physical: For full details, please see admission history and physical. Briefly, Ricardo Lutz is a 63 y.o. year old patient with GG3 prostate cancer .   Hospital Course: Patient tolerated the procedure well.  He was then transferred to the floor after an uneventful PACU stay.  His hospital course was uncomplicated.  On POD#1 he had met discharge criteria: was eating a regular diet, was up and ambulating independently,  and pain was well controlled. On exam his abdomen was soft, and he was passing gas. A fluid creatinine was obtained the drain output was serous.   Patients labs were normal and patient was ready to for discharge.  Drain was removed prior to discharge    Laboratory values:  Recent Labs    01/09/24 1250 01/10/24 0410  WBC 11.0* 10.6*  HGB 14.1 12.6*  HCT 40.2 37.6*   Recent Labs    01/09/24 1250 01/10/24 0410  NA 133* 134*  K 4.0 4.5  CL 100 103  CO2 24 25  GLUCOSE 195* 139*  BUN 13 10  CREATININE 1.20 1.14  CALCIUM  8.6* 8.4*   No results for input(s): LABPT, INR in the last 72 hours. No results for input(s): LABURIN in the last 72 hours. No results found for this or any previous visit.  Disposition: Home  Discharge instruction: The patient was instructed to be ambulatory but told to refrain from heavy lifting, strenuous activity, or driving.   Discharge medications:  Allergies as of 01/10/2024   No Known Allergies      Medication List     STOP taking these medications    ibuprofen 200 MG tablet Commonly known as: ADVIL   loperamide 2 MG  capsule Commonly known as: IMODIUM   meloxicam 15 MG tablet Commonly known as: MOBIC   Sleep Aid 25 MG tablet Generic drug: doxylamine (Sleep)       TAKE these medications    cetirizine 10 MG tablet Commonly known as: ZYRTEC Take 10 mg by mouth daily as needed for allergies.   DULoxetine  HCl 40 MG Cpep Take 40 mg by mouth daily.   HYDROcodone -acetaminophen  5-325 MG tablet Commonly known as: NORCO/VICODIN Take 1-2 tablets by mouth every 6 (six) hours as needed for moderate pain (pain score 4-6) or severe pain (pain score 7-10).   metFORMIN 1000 MG tablet Commonly known as: GLUCOPHAGE Take 1,000 mg by mouth 2 (two) times daily with a meal.   methocarbamol  500 MG tablet Commonly known as: ROBAXIN  Take 1 tablet (500 mg total) by mouth 3 (three) times daily.   polyethylene glycol powder 17 GM/SCOOP powder Commonly known as: MiraLax  Take daily per package instructions   rosuvastatin  40 MG tablet Commonly known as: CRESTOR  Take 40 mg by mouth daily.   senna 8.6 MG Tabs tablet Commonly known as: SENOKOT Take 1 tablet (8.6 mg total) by mouth daily for 7 days.   sulfamethoxazole -trimethoprim  800-160 MG tablet Commonly known as: BACTRIM  DS Take 1 tablet by mouth 2 (two) times daily. Start the day prior to foley removal appointment        Followup:   Follow-up Information     Claudene Waddell HERO, PA-C Follow up on  01/19/2024.   Why: at 8:30 Contact information: [address] [place] [place] [address] [phone]

## 2024-01-10 NOTE — Plan of Care (Signed)
  Problem: Education: Goal: Knowledge of the procedure and recovery process will improve Outcome: Progressing   Problem: Bowel/Gastric: Goal: Gastrointestinal status for postoperative course will improve Outcome: Progressing   Problem: Pain Management: Goal: General experience of comfort will improve Outcome: Progressing   Problem: Skin Integrity: Goal: Demonstration of wound healing without infection will improve Outcome: Progressing

## 2024-01-12 LAB — SURGICAL PATHOLOGY

## 2024-01-26 ENCOUNTER — Encounter: Payer: Self-pay | Admitting: Podiatry

## 2024-01-26 ENCOUNTER — Ambulatory Visit: Admitting: Podiatry

## 2024-01-26 ENCOUNTER — Ambulatory Visit (INDEPENDENT_AMBULATORY_CARE_PROVIDER_SITE_OTHER)

## 2024-01-26 VITALS — Ht 74.0 in | Wt 194.2 lb

## 2024-01-26 DIAGNOSIS — M7752 Other enthesopathy of left foot: Secondary | ICD-10-CM | POA: Diagnosis not present

## 2024-01-26 DIAGNOSIS — E0842 Diabetes mellitus due to underlying condition with diabetic polyneuropathy: Secondary | ICD-10-CM | POA: Diagnosis not present

## 2024-01-26 DIAGNOSIS — M25572 Pain in left ankle and joints of left foot: Secondary | ICD-10-CM | POA: Diagnosis not present

## 2024-01-26 DIAGNOSIS — M7751 Other enthesopathy of right foot: Secondary | ICD-10-CM | POA: Diagnosis not present

## 2024-01-26 DIAGNOSIS — M25571 Pain in right ankle and joints of right foot: Secondary | ICD-10-CM

## 2024-01-26 NOTE — Progress Notes (Signed)
   Chief Complaint  Patient presents with   Foot Pain    Pt is here due to bilateral foot pain that has been going on for years, has seen many doctors for this issue with no help in relief states he has received many injections, took many different medications and nothing has helped with the pain, states the pain is constant.    HPI: 63 y.o. 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  This has been an ongoing pathology for several years and she has tried multiple modalities including lumbar spine injections, oral medication, topical medication, without any improvement.  Past Medical History:  Diagnosis Date   Anxiety    Atypical mole    Depression    Diabetes mellitus without complication (HCC)    Prostate cancer (HCC)    SCC (squamous cell carcinoma) 12/28/2016   tip of nose cx3 30fu   Skin cancer    Squamous cell carcinoma of skin 03/02/2010   left cheek cx3 71fu   Superficial basal cell carcinoma (BCC) 06/10/2020   Right Inner Forearm (tx p bx)    Past Surgical History:  Procedure Laterality Date   COLONOSCOPY     PROSTATE BIOPSY     ROBOT ASSISTED LAPAROSCOPIC RADICAL PROSTATECTOMY N/A 01/09/2024   Procedure: PROSTATECTOMY, RADICAL, ROBOT-ASSISTED, LAPAROSCOPIC;  Surgeon: Shane Steffan BROCKS, MD;  Location: [facility];  Service: Urology;  Laterality: N/A;   SKIN CANCER EXCISION      No Known Allergies   Physical Exam: General: The patient is alert and oriented x3 in no acute distress.  Dermatology: Skin is warm, dry and supple bilateral lower extremities.   Vascular: Palpable pedal pulses bilaterally. Capillary refill within normal limits.  No appreciable edema.  No erythema.  Neurological: Grossly intact via light touch  Musculoskeletal Exam: No pedal deformities noted  Assessment/Plan of Care: 1.  Diabetic peripheral polyneuropathy x several years  -Patient evaluated -Today we had a long  discussion regarding different treatment options and modalities for peripheral polyneuropathy.  Unfortunately the patient has seen multiple neurologists as well as tried different modalities including lumbar spine injections, oral medication such as gabapentin and Lyrica, topical medication without any relief of his symptoms.  He continues to have chronic severe persistent neuropathy -Referral placed to Dr. Wallie Sherry      Thresa EMERSON Sar, DPM [facility]  Dr. Thresa EMERSON Sar, DPM    [address] [place], [place] [address]                Office [phone]  Fax [phone]

## 2024-03-28 ENCOUNTER — Encounter: Payer: Self-pay | Admitting: Student in an Organized Health Care Education/Training Program

## 2024-03-28 ENCOUNTER — Ambulatory Visit
Attending: Student in an Organized Health Care Education/Training Program | Admitting: Student in an Organized Health Care Education/Training Program

## 2024-03-28 VITALS — BP 143/93 | HR 90 | Temp 97.2°F | Resp 16 | Ht 74.0 in | Wt 188.0 lb

## 2024-03-28 DIAGNOSIS — E1140 Type 2 diabetes mellitus with diabetic neuropathy, unspecified: Secondary | ICD-10-CM | POA: Insufficient documentation

## 2024-03-28 DIAGNOSIS — Z7985 Long-term (current) use of injectable non-insulin antidiabetic drugs: Secondary | ICD-10-CM | POA: Diagnosis not present

## 2024-03-28 DIAGNOSIS — G894 Chronic pain syndrome: Secondary | ICD-10-CM | POA: Diagnosis present

## 2024-03-28 NOTE — Patient Instructions (Signed)
 Capsaicin Topical System What is this medication? CAPSAICIN (cap SAY sin) treats nerve pain. It works by making your skin feel warm or cool, which blocks pain signals going to the brain. This medicine may be used for other purposes; ask your health care provider or pharmacist if you have questions. COMMON BRAND NAME(S): Qutenza What should I tell my care team before I take this medication? They need to know if you have any of these conditions: Have had a heart attack or stroke High blood pressure Large areas of burned or damaged skin An unusual or allergic reaction to capsaicin, hot peppers, other medications, foods, dyes, or preservatives Pregnant or trying to get pregnant Breastfeeding How should I use this medication? This medication is for external use only. It is applied by your care team in a hospital or clinic setting. Talk to your care team about the use of this medication in children. Special care may be needed. Overdosage: If you think you have taken too much of this medicine contact a poison control center or emergency room at once. NOTE: This medicine is only for you. Do not share this medicine with others. What if I miss a dose? This does not apply. What may interact with this medication? Interactions are not expected. Do not use any other skin products on the affected area without asking your care team. This list may not describe all possible interactions. Give your health care provider a list of all the medicines, herbs, non-prescription drugs, or dietary supplements you use. Also tell them if you smoke, drink alcohol, or use illegal drugs. Some items may interact with your medicine. What should I watch for while using this medication? Your condition will be monitored carefully while you are receiving this medication. Tell your care team if your symptoms do not start to get better or if they get worse. Talk to your care team about how to treat discomfort. You may place a  cooling pack from the refrigerator (not the freezer) on the area. Do not place it directly on the skin. Try not to touch the area where the patch was applied. If you do, wash your hands with soap and water right away. Your skin may be sensitive to heat for a few days after treatment. Avoid hot baths or showers, heating pads, and direct sunlight on the treated area. What side effects may I notice from receiving this medication? Side effects that you should report to your care team as soon as possible: Allergic reactions--skin rash, itching, hives, swelling of the face, lips, tongue, or throat Burning, itching, crusting, or peeling of treated skin Increase in blood pressure Numbness, decrease in sense of touch or sensation Side effects that usually do not require medical attention (report these to your care team if they continue or are bothersome): Mild skin irritation, redness, or dryness This list may not describe all possible side effects. Call your doctor for medical advice about side effects. You may report side effects to FDA at 1-800-FDA-1088. Where should I keep my medication? This medication is given in a hospital or clinic. It will not be stored at home. NOTE: This sheet is a summary. It may not cover all possible information. If you have questions about this medicine, talk to your doctor, pharmacist, or health care provider.  2024 Elsevier/Gold Standard (2023-05-12 00:00:00)

## 2024-03-28 NOTE — Progress Notes (Signed)
 PROVIDER NOTE: Interpretation of information contained herein should be left to medically-trained personnel. Specific patient instructions are provided elsewhere under Patient Instructions section of medical record. This document was created in part using AI and STT-dictation technology, any transcriptional errors that may result from this process are unintentional.  Patient: Ricardo Lutz  Service: E/M Encounter  Provider: Wallie Sherry, MD  DOB: 07-18-1960  Delivery: Face-to-face  Specialty: Interventional Pain Management  MRN: [id number]  Setting: Ambulatory outpatient facility  Specialty designation: 09  Type: New Patient  Location: Outpatient office facility  PCP: Leonel Cole, MD  DOS: 03/28/2024    Referring Prov.: Janit Thresa HERO, DPM   Primary Reason(s) for Visit: Encounter for initial evaluation of one or more chronic problems (new to examiner) potentially causing chronic pain, and posing a threat to normal musculoskeletal function. (Level of risk: High) CC: Peripheral Neuropathy  HPI  Ricardo Lutz is a 63 y.o. year old, male patient, who comes for the first time to our practice referred by Janit Thresa HERO, DPM for our initial evaluation of his chronic pain. He has Prostate cancer (HCC); Chronic painful diabetic neuropathy (HCC); and Chronic pain syndrome on their problem list. Today he comes in for evaluation of his Peripheral Neuropathy  Pain Assessment: Location: Right, Left Foot (primarily toes and dorsal aspects of feet bilat) Radiating: denies Onset: More than a month ago Duration: Neuropathic pain Quality: Tingling, Numbness, Burning Severity: 1 /10 (subjective, self-reported pain score)  Effect on ADL: disrupts sleep Timing: Intermittent Modifying factors: nothing BP: (!) 143/93  HR: 90  Onset and Duration: Present longer than 3 months Cause of pain: Unknown Severity: Getting worse and NAS-11 at its worse: 9/10 Timing: Night Aggravating Factors: lying  down Alleviating Factors: nothing Associated Problems: Depression, Fatigue, Numbness, Tingling, Pain that wakes patient up, and Pain that does not allow patient to sleep Quality of Pain: Aching, Agonizing, Annoying, Burning, Intermittent, Exhausting, Tingling, and Uncomfortable Previous Examinations or Tests: Nerve conduction test Previous Treatments: Epidural steroid injections  Ricardo Lutz is being evaluated for possible interventional pain management therapies for the treatment of his chronic pain.  Discussed the use of AI scribe software for clinical note transcription with the patient, who gave verbal consent to proceed.  History of Present Illness   Ricardo Lutz is a 63 year old male with type 2 diabetes who presents with diabetic neuropathy in his feet. He was referred by Dr. Janit, a podiatrist, for evaluation of diabetic neuropathy.  He experiences severe pain in his feet, described as 'horrible' tingling and numbness, particularly on the lateral aspects. The pain worsens with wearing socks or shoes during flare-ups. There is no worsening of pain in the winter months and no significant discoloration, though slight redness may occur.  He has a history of type 2 diabetes for 15 to 20 years, with his last known A1c in the eights. He is not on insulin , but his blood sugar levels have occasionally peaked at night.  For neuropathy treatment, he previously received epidural steroid injections a couple of years ago and has tried various medications, including gabapentin at 300 mg three times a day, which he started around July 16th. He is not currently on any medication for this condition. He has also used over-the-counter capsaicin for muscle pain, applied to his feet.        Meds   Current Outpatient Medications:    cetirizine (ZYRTEC) 10 MG tablet, Take 10 mg by mouth daily as needed for allergies., Disp: ,  Rfl:    DULoxetine  HCl 40 MG CPEP, Take 40 mg by mouth daily.,  Disp: , Rfl:    metFORMIN (GLUCOPHAGE) 1000 MG tablet, Take 1,000 mg by mouth 2 (two) times daily with a meal., Disp: , Rfl:    rosuvastatin  (CRESTOR ) 40 MG tablet, Take 40 mg by mouth daily., Disp: , Rfl:    HYDROcodone -acetaminophen  (NORCO/VICODIN) 5-325 MG tablet, Take 1-2 tablets by mouth every 6 (six) hours as needed for moderate pain (pain score 4-6) or severe pain (pain score 7-10)., Disp: 20 tablet, Rfl: 0   methocarbamol  (ROBAXIN ) 500 MG tablet, Take 1 tablet (500 mg total) by mouth 3 (three) times daily., Disp: 20 tablet, Rfl: 0   polyethylene glycol powder (MIRALAX ) 17 GM/SCOOP powder, Take daily per package instructions, Disp: , Rfl:    sulfamethoxazole -trimethoprim  (BACTRIM  DS) 800-160 MG tablet, Take 1 tablet by mouth 2 (two) times daily. Start the day prior to foley removal appointment, Disp: 6 tablet, Rfl: 0  ROS  Cardiovascular: No reported cardiovascular signs or symptoms such as High blood pressure, coronary artery disease, abnormal heart rate or rhythm, heart attack, blood thinner therapy or heart weakness and/or failure Pulmonary or Respiratory: Snoring  and Temporary stoppage of breathing during sleep Neurological: Abnormal skin sensations (Peripheral Neuropathy) Psychological-Psychiatric: Depressed and Difficulty sleeping and or falling asleep Gastrointestinal: No reported gastrointestinal signs or symptoms such as vomiting or evacuating blood, reflux, heartburn, alternating episodes of diarrhea and constipation, inflamed or scarred liver, or pancreas or irrregular and/or infrequent bowel movements Genitourinary: No reported renal or genitourinary signs or symptoms such as difficulty voiding or producing urine, peeing blood, non-functioning kidney, kidney stones, difficulty emptying the bladder, difficulty controlling the flow of urine, or chronic kidney disease Hematological: No reported hematological signs or symptoms such as prolonged bleeding, low or poor functioning  platelets, bruising or bleeding easily, hereditary bleeding problems, low energy levels due to low hemoglobin or being anemic Endocrine: No reported endocrine signs or symptoms such as high or low blood sugar, rapid heart rate due to high thyroid  levels, obesity or weight gain due to slow thyroid  or thyroid  disease Rheumatologic: No reported rheumatological signs and symptoms such as fatigue, joint pain, tenderness, swelling, redness, heat, stiffness, decreased range of motion, with or without associated rash Musculoskeletal: Negative for myasthenia gravis, muscular dystrophy, multiple sclerosis or malignant hyperthermia Work History: Unemployed  Allergies  Ricardo Lutz has no known allergies.  Laboratory Chemistry Profile   Renal Lab Results  Component Value Date   BUN 10 01/10/2024   CREATININE 1.14 01/10/2024   GFRNONAA >60 01/10/2024     Electrolytes Lab Results  Component Value Date   NA 134 (L) 01/10/2024   K 4.5 01/10/2024   CL 103 01/10/2024   CALCIUM  8.4 (L) 01/10/2024     Hepatic No results found for: AST, ALT, ALBUMIN, ALKPHOS, AMYLASE, LIPASE, AMMONIA   ID No results found for: LYMEIGGIGMAB, HIV, SARSCOV2NAA, STAPHAUREUS, MRSAPCR, HCVAB, PREGTESTUR, RMSFIGG, QFVRPH1IGG, QFVRPH2IGG   Bone No results found for: VD25OH, CI874NY7UNU, CI6874NY7, CI7874NY7, 25OHVITD1, 25OHVITD2, 25OHVITD3, TESTOFREE, TESTOSTERONE   Endocrine Lab Results  Component Value Date   GLUCOSE 139 (H) 01/10/2024   HGBA1C 8.5 (H) 12/28/2023     Neuropathy Lab Results  Component Value Date   HGBA1C 8.5 (H) 12/28/2023     CNS No results found for: COLORCSF, APPEARCSF, RBCCOUNTCSF, WBCCSF, POLYSCSF, LYMPHSCSF, EOSCSF, PROTEINCSF, GLUCCSF, JCVIRUS, CSFOLI, IGGCSF, LABACHR, ACETBL   Inflammation (CRP: Acute  ESR: Chronic) No results found for: CRP, ESRSEDRATE, LATICACIDVEN  Rheumatology No results  found for: RF, ANA, LABURIC, URICUR, LYMEIGGIGMAB, LYMEABIGMQN, HLAB27   Coagulation Lab Results  Component Value Date   PLT 128 (L) 01/10/2024     Cardiovascular Lab Results  Component Value Date   HGB 12.6 (L) 01/10/2024   HCT 37.6 (L) 01/10/2024     Screening No results found for: SARSCOV2NAA, COVIDSOURCE, STAPHAUREUS, MRSAPCR, HCVAB, HIV, PREGTESTUR   Cancer No results found for: CEA, CA125, LABCA2   Allergens No results found for: ALMOND, APPLE, ASPARAGUS, AVOCADO, BANANA, BARLEY, BASIL, BAYLEAF, GREENBEAN, LIMABEAN, WHITEBEAN, BEEFIGE, REDBEET, BLUEBERRY, BROCCOLI, CABBAGE, MELON, CARROT, CASEIN, CASHEWNUT, CAULIFLOWER, CELERY     Note: Lab results reviewed.  PFSH  Drug: Ricardo Lutz  reports no history of drug use. Alcohol:  reports current alcohol use of about 7.0 standard drinks of alcohol per week. Tobacco:  reports that he has never smoked. He has never used smokeless tobacco. Medical:  has a past medical history of Anxiety, Atypical mole, Depression, Diabetes mellitus without complication (HCC), Prostate cancer (HCC), SCC (squamous cell carcinoma) (12/28/2016), Skin cancer, Squamous cell carcinoma of skin (03/02/2010), and Superficial basal cell carcinoma (BCC) (06/10/2020). Family: family history is not on file.  Past Surgical History:  Procedure Laterality Date   COLONOSCOPY     PROSTATE BIOPSY     ROBOT ASSISTED LAPAROSCOPIC RADICAL PROSTATECTOMY N/A 01/09/2024   Procedure: PROSTATECTOMY, RADICAL, ROBOT-ASSISTED, LAPAROSCOPIC;  Surgeon: Shane Steffan BROCKS, MD;  Location: [facility];  Service: Urology;  Laterality: N/A;   SKIN CANCER EXCISION     Active Ambulatory Problems    Diagnosis Date Noted   Prostate cancer (HCC) 01/09/2024   Chronic painful diabetic neuropathy (HCC) 03/28/2024   Chronic pain syndrome 03/28/2024   Resolved Ambulatory Problems    Diagnosis Date Noted   No  Resolved Ambulatory Problems   Past Medical History:  Diagnosis Date   Anxiety    Atypical mole    Depression    Diabetes mellitus without complication (HCC)    SCC (squamous cell carcinoma) 12/28/2016   Skin cancer    Squamous cell carcinoma of skin 03/02/2010   Superficial basal cell carcinoma (BCC) 06/10/2020   Constitutional Exam  General appearance: Well nourished, well developed, and well hydrated. In no apparent acute distress Vitals:   03/28/24 0806  BP: (!) 143/93  Pulse: 90  Resp: 16  Temp: (!) 97.2 F (36.2 C)  SpO2: 100%  Weight: 188 lb (85.3 kg)  Height: 6' 2 (1.88 m)   BMI Assessment: Estimated body mass index is 24.14 kg/m as calculated from the following:   Height as of this encounter: 6' 2 (1.88 m).   Weight as of this encounter: 188 lb (85.3 kg).  BMI interpretation table: BMI level Category Range association with higher incidence of chronic pain  <18 kg/m2 Underweight   18.5-24.9 kg/m2 Ideal body weight   25-29.9 kg/m2 Overweight Increased incidence by 20%  30-34.9 kg/m2 Obese (Class I) Increased incidence by 68%  35-39.9 kg/m2 Severe obesity (Class II) Increased incidence by 136%  >40 kg/m2 Extreme obesity (Class III) Increased incidence by 254%   Patient's current BMI Ideal Body weight  Body mass index is 24.14 kg/m. Ideal body weight: 82.2 kg (181 lb 3.5 oz) Adjusted ideal body weight: 83.4 kg (183 lb 14.9 oz)   BMI Readings from Last 4 Encounters:  03/28/24 24.14 kg/m  01/26/24 24.93 kg/m  01/09/24 24.93 kg/m  12/28/23 24.93 kg/m   Wt Readings from Last 4 Encounters:  03/28/24 188 lb (85.3 kg)  01/26/24 194 lb 3.2 oz (88.1 kg)  01/09/24 194 lb 3.2 oz (88.1 kg)  12/28/23 194 lb 3.2 oz (88.1 kg)    Psych/Mental status: Alert, oriented x 3 (person, place, & time)       Eyes: PERLA Respiratory: No evidence of acute respiratory distress Paresthesias bilateral feet  Assessment  Primary Diagnosis & Pertinent Problem List: The  primary encounter diagnosis was Chronic painful diabetic neuropathy (HCC). A diagnosis of Chronic pain syndrome was also pertinent to this visit.  Visit Diagnosis (New problems to examiner): 1. Chronic painful diabetic neuropathy (HCC)   2. Chronic pain syndrome    Plan of Care (Initial workup plan)  The patient presents with chronic painful diabetic peripheral neuropathy, characterized by burning, tingling, and paresthesias in both feet, consistent with distal symmetric polyneuropathy. He was referred by Dr. Thresa Sar (Podiatry) for further management after limited or no sustained benefit from prior therapies, including epidural steroid injections, and multiple neuropathic medications such as gabapentin, pregabalin (Lyrica), and topical lidocaine .  Given the chronicity and refractory nature of his neuropathic pain, we discussed advanced, evidence-based treatment options to improve pain control and quality of life. One option is Qutenza (capsaicin 8% topical system), which provides targeted desensitization of cutaneous nociceptors through high-concentration capsaicin and has been FDA-approved for painful diabetic neuropathy of the feet. It can offer meaningful pain relief lasting up to 3 months after a single in-office application and may help reduce reliance on systemic medications.  For more severe or persistent neuropathic pain not adequately managed by topical or pharmacologic therapy, we also discussed the potential role of spinal cord stimulation (SCS). SCS is FDA-approved for painful diabetic neuropathy and has demonstrated significant reductions in pain scores and improvements in sleep, function, and quality of life in this population. The patient may be an excellent candidate for a trial of SCS, particularly given his failure of conventional medical and injection-based management.  The patient understands the rationale, mechanism, and expectations for both Qutenza and SCS therapy. We will  proceed with Qutenza application as the next step in management and reassess clinical response. Should pain remain inadequately controlled, we will plan for evaluation and potential trial of spinal cord stimulation. Continue optimal glycemic control and neuropathic pain medications as tolerated in coordination with his primary care and endocrinology teams.  Procedure Orders         NEUROLYSIS    Provider-requested follow-up: Return in about 6 days (around 04/03/2024) for Qutenza #1.  No future appointments. I discussed the assessment and treatment plan with the patient. The patient was provided an opportunity to ask questions and all were answered. The patient agreed with the plan and demonstrated an understanding of the instructions.  Patient advised to call back or seek an in-person evaluation if the symptoms or condition worsens.  I personally spent a total of 40 minutes in the care of the patient today including preparing to see the patient, getting/reviewing separately obtained history, performing a medically appropriate exam/evaluation, counseling and educating, placing orders, and documenting clinical information in the EHR.  Date: 03/28/2024; Time: 10:21 AM

## 2024-03-28 NOTE — Progress Notes (Signed)
 Safety precautions to be maintained throughout the outpatient stay will include: orient to surroundings, keep bed in low position, maintain call bell within reach at all times, provide assistance with transfer out of bed and ambulation.

## 2024-04-03 ENCOUNTER — Encounter: Payer: Self-pay | Admitting: Student in an Organized Health Care Education/Training Program

## 2024-04-03 ENCOUNTER — Ambulatory Visit
Attending: Student in an Organized Health Care Education/Training Program | Admitting: Student in an Organized Health Care Education/Training Program

## 2024-04-03 DIAGNOSIS — G894 Chronic pain syndrome: Secondary | ICD-10-CM | POA: Insufficient documentation

## 2024-04-03 DIAGNOSIS — E1140 Type 2 diabetes mellitus with diabetic neuropathy, unspecified: Secondary | ICD-10-CM | POA: Insufficient documentation

## 2024-04-03 MED ORDER — CAPSAICIN-CLEANSING GEL 8 % EX KIT
4.0000 | PACK | Freq: Once | CUTANEOUS | Status: AC
  Administered 2024-04-03: 4 via TOPICAL
  Filled 2024-04-03: qty 4

## 2024-04-03 NOTE — Progress Notes (Signed)
 PROVIDER NOTE: Interpretation of information contained herein should be left to medically-trained personnel. Specific patient instructions are provided elsewhere under Patient Instructions section of medical record. This document was created in part using STT-dictation technology, any transcriptional errors that may result from this process are unintentional.  Patient: Ricardo Lutz Type: Established DOB: 07/02/1960 MRN: [id number] PCP: Leonel Cole, MD  Service: Procedure DOS: 04/03/2024 Setting: Ambulatory Location: Ambulatory outpatient facility Delivery: Face-to-face Provider: Wallie Sherry, MD Specialty: Interventional Pain Management Specialty designation: 09 Location: Outpatient facility Ref. Prov.: Sherry Wallie, MD       Interventional Therapy   Type: Qutenza Neurolysis #1  Laterality:  Bilateral Area treated: Feet Imaging Guidance: None Anesthesia/analgesia/anxiolysis/sedation: None required Medication (Right): Qutenza (capsaicin 8%) topical system Medication (Left): Qutenza (capsaicin 8%) topical system Date: 04/03/2024 Performed by: Wallie Sherry, MD Rationale (medical necessity): procedure needed and proper for the treatment of Ricardo Lutz's medical symptoms and needs. Indication: Painful diabetic peripheral neuralgia (DPN) (ICD-10-CM:E11.40) severe enough to impact quality of life or function. 1. Chronic painful diabetic neuropathy (HCC)   2. Chronic pain syndrome    NAS-11 Pain score:   Pre-procedure: 2 /10   Post-procedure: 2 /10     Position / Prep / Materials:  Position: Supine  Materials: Qutenza Kit (4 patches)  H&P (Pre-op Assessment):  Ricardo Lutz is a 63 y.o. (year old), male patient, seen today for interventional treatment. He  has a past surgical history that includes Prostate biopsy; Skin cancer excision; Colonoscopy; and Robot assisted laparoscopic radical prostatectomy (N/A, 01/09/2024). [name] has a current medication list which  includes the following prescription(s): cetirizine, duloxetine  hcl, metformin, rosuvastatin , hydrocodone -acetaminophen , methocarbamol , polyethylene glycol powder, and sulfamethoxazole -trimethoprim . His primarily concern today is the Foot Pain (bilateral)  Initial Vital Signs:  Pulse/HCG Rate: 98  Temp: 98 F (36.7 C) Resp: 16 BP: (!) 144/96 SpO2: 100 %  BMI: Estimated body mass index is 24.14 kg/m as calculated from the following:   Height as of this encounter: 6' 2 (1.88 m).   Weight as of this encounter: 188 lb (85.3 kg).  Risk Assessment: Allergies: Reviewed. He has no known allergies.  Allergy Precautions: None required Coagulopathies: Reviewed. None identified.  Blood-thinner therapy: None at this time Active Infection(s): Reviewed. None identified. Ricardo Lutz is afebrile  Site Confirmation: Ricardo Lutz was asked to confirm the procedure and laterality before marking the site Procedure checklist: Completed Consent: Before the procedure and under the influence of no sedative(s), amnesic(s), or anxiolytics, the patient was informed of the treatment options, risks and possible complications. To fulfill our ethical and legal obligations, as recommended by the American Medical Association's Code of Ethics, I have informed the patient of my clinical impression; the nature and purpose of the treatment or procedure; the risks, benefits, and possible complications of the intervention; the alternatives, including doing nothing; the risk(s) and benefit(s) of the alternative treatment(s) or procedure(s); and the risk(s) and benefit(s) of doing nothing. The patient was provided information about the general risks and possible complications associated with the procedure. These may include, but are not limited to: failure to achieve desired goals, infection, bleeding, organ or nerve damage, allergic reactions, paralysis, and death. In addition, the patient was informed of those risks and  complications associated to the procedure, such as failure to decrease pain; infection; bleeding; organ or nerve damage with subsequent damage to sensory, motor, and/or autonomic systems, resulting in permanent pain, numbness, and/or weakness of one or several areas of the body; allergic reactions; (i.e.: anaphylactic reaction);  and/or death. Furthermore, the patient was informed of those risks and complications associated with the medications. These include, but are not limited to: allergic reactions (i.e.: anaphylactic or anaphylactoid reaction(s)); adrenal axis suppression; blood sugar elevation that in diabetics may result in ketoacidosis or comma; water retention that in patients with history of congestive heart failure may result in shortness of breath, pulmonary edema, and decompensation with resultant heart failure; weight gain; swelling or edema; medication-induced neural toxicity; particulate matter embolism and blood vessel occlusion with resultant organ, and/or nervous system infarction; and/or aseptic necrosis of one or more joints. Finally, the patient was informed that Medicine is not an exact science; therefore, there is also the possibility of unforeseen or unpredictable risks and/or possible complications that may result in a catastrophic outcome. The patient indicated having understood very clearly. We have given the patient no guarantees and we have made no promises. Enough time was given to the patient to ask questions, all of which were answered to the patient's satisfaction. Ricardo Lutz has indicated that he wanted to continue with the procedure. Attestation: I, the ordering provider, attest that I have discussed with the patient the benefits, risks, side-effects, alternatives, likelihood of achieving goals, and potential problems during recovery for the procedure that I have provided informed consent. Date  Time: 04/03/2024 12:36 PM  Pre-Procedure Preparation:  Monitoring: As per  clinic protocol. Respiration, ETCO2, SpO2, BP, heart rate and rhythm monitor placed and checked for adequate function Safety Precautions: Patient was assessed for positional comfort and pressure points before starting the procedure. Time-out: I initiated and conducted the Time-out before starting the procedure, as per protocol. The patient was asked to participate by confirming the accuracy of the Time Out information. Verification of the correct person, site, and procedure were performed and confirmed by me, the nursing staff, and the patient. Time-out conducted as per Joint Commission's Universal Protocol ([email or web address].01.01.01). Time: 1259 Start Time: 1305 hrs.  Description/Narrative of Procedure:          Region: Distal lower extremities Target Area: Sensory peripheral nerves affected by diabetic peripheral neuropathy Site: Feet Approach: Percutaneous  No./Series: Not applicable  Type: Percutaneous  Purpose: Therapeutic  Region: Distal lower extremities  Start Time: 1305 hrs.  Description of the Procedure: Protocol guidelines were followed. The patient was assisted into a comfortable position.  Informed consent was obtained in the patient monitored in the usual manner.  All questions were answered prior to the procedure.  They Qutenza patches were applied to the affected area and then covered with the wrap.  The Patient was kept under observation until the treatment was completed.  The patches were removed and the treated area was inspected.  Vitals:   04/03/24 1244  BP: (!) 144/96  Pulse: 98  Resp: 16  Temp: 98 F (36.7 C)  SpO2: 100%  Weight: 188 lb (85.3 kg)  Height: 6' 2 (1.88 m)     End Time:   hrs.  Type of Imaging Technique: None used Indication(s): N/A Exposure Time: No patient exposure Contrast: None used. Fluoroscopic Guidance: N/A Ultrasound Guidance: N/A Interpretation: N/A  Post-operative Assessment:  Post-procedure Vital Signs:  Pulse/HCG Rate: 98  Temp: 98  F (36.7 C) Resp: 16 BP: (!) 144/96 SpO2: 100 %  EBL: None  Complications: No immediate post-treatment complications observed by team, or reported by patient.  Note: The patient tolerated the entire procedure well. A repeat set of vitals were taken after the procedure and the patient was kept under observation following institutional  policy, for this type of procedure. Post-procedural neurological assessment was performed, showing return to baseline, prior to discharge. The patient was provided with post-procedure discharge instructions, including a section on how to identify potential problems. Should any problems arise concerning this procedure, the patient was given instructions to immediately contact us , at any time, without hesitation. In any case, we plan to contact the patient by telephone for a follow-up status report regarding this interventional procedure.  Comments:  No additional relevant information.  Plan of Care (POC)  Orders:  Orders Placed This Encounter  Procedures   NEUROLYSIS    Please order Qutenza patches    Standing Status:   Future    Expiration Date:   07/04/2024    Where will this procedure be performed?:   [facility]     Medications ordered for procedure: Meds ordered this encounter  Medications   capsaicin topical system 8 % patch 4 patch   Medications administered: We administered capsaicin topical system.  See the medical record for exact dosing, route, and time of administration.    Qutenza No. 1 04/03/2024    Follow-up plan:   Return in about 3 months (around 07/04/2024) for Qutenza #2.     Recent Visits Date Type Provider Dept  03/28/24 Office Visit Marcelino Nurse, MD [facility]  Showing recent visits within past 90 days and meeting all other requirements Today's Visits Date Type Provider Dept  04/03/24 Procedure visit Marcelino Nurse, MD [facility]  Showing today's visits and meeting all other  requirements Future Appointments No visits were found meeting these conditions. Showing future appointments within next 90 days and meeting all other requirements   Disposition: Discharge home  Discharge (Date  Time): 04/03/2024;   hrs.   Primary Care Physician: Leonel Cole, MD Location: [facility] Note by: Nurse Marcelino, MD (TTS technology used. I apologize for any typographical errors that were not detected and corrected.) Date: 04/03/2024; Time: 1:57 PM  Disclaimer:  Medicine is not an Visual merchandiser. The only guarantee in medicine is that nothing is guaranteed. It is important to note that the decision to proceed with this intervention was based on the information collected from the patient. The Data and conclusions were drawn from the patient's questionnaire, the interview, and the physical examination. Because the information was provided in large part by the patient, it cannot be guaranteed that it has not been purposely or unconsciously manipulated. Every effort has been made to obtain as much relevant data as possible for this evaluation. It is important to note that the conclusions that lead to this procedure are derived in large part from the available data. Always take into account that the treatment will also be dependent on availability of resources and existing treatment guidelines, considered by other Pain Management Practitioners as being common knowledge and practice, at the time of the intervention. For Medico-Legal purposes, it is also important to point out that variation in procedural techniques and pharmacological choices are the acceptable norm. The indications, contraindications, technique, and results of the above procedure should only be interpreted and judged by a Board-Certified Interventional Pain Specialist with extensive familiarity and expertise in the same exact procedure and technique.

## 2024-04-03 NOTE — Patient Instructions (Signed)

## 2024-04-03 NOTE — Progress Notes (Signed)
 Safety precautions to be maintained throughout the outpatient stay will include: orient to surroundings, keep bed in low position, maintain call bell within reach at all times, provide assistance with transfer out of bed and ambulation.

## 2024-04-04 ENCOUNTER — Telehealth: Payer: Self-pay

## 2024-04-04 NOTE — Telephone Encounter (Signed)
 Post procedure follow up.  LM

## 2024-07-03 ENCOUNTER — Encounter: Payer: Self-pay | Admitting: Student in an Organized Health Care Education/Training Program

## 2024-07-03 ENCOUNTER — Ambulatory Visit: Admitting: Student in an Organized Health Care Education/Training Program

## 2024-07-03 VITALS — BP 137/88 | HR 97 | Temp 97.9°F | Resp 18 | Ht 74.0 in | Wt 189.0 lb

## 2024-07-03 DIAGNOSIS — E1140 Type 2 diabetes mellitus with diabetic neuropathy, unspecified: Secondary | ICD-10-CM | POA: Diagnosis present

## 2024-07-03 DIAGNOSIS — G894 Chronic pain syndrome: Secondary | ICD-10-CM | POA: Diagnosis present

## 2024-07-03 MED ORDER — CAPSAICIN-CLEANSING GEL 8 % EX KIT
4.0000 | PACK | Freq: Once | CUTANEOUS | Status: AC
  Administered 2024-07-03: 4 via TOPICAL

## 2024-07-03 NOTE — Progress Notes (Signed)
 PROVIDER NOTE: Interpretation of information contained herein should be left to medically-trained personnel. Specific patient instructions are provided elsewhere under Patient Instructions section of medical record. This document was created in part using STT-dictation technology, any transcriptional errors that may result from this process are unintentional.  Patient: Ricardo Lutz Type: Established DOB: May 25, 1961 MRN: [id number] PCP: Leonel Cole, MD  Service: Procedure DOS: 07/03/2024 Setting: Ambulatory Location: Ambulatory outpatient facility Delivery: Face-to-face Provider: Wallie Sherry, MD Specialty: Interventional Pain Management Specialty designation: 09 Location: Outpatient facility Ref. Prov.: Leonel Cole, MD       Interventional Therapy   Type: Qutenza  Neurolysis #2  Laterality:  Bilateral Area treated: Feet Imaging Guidance: None Anesthesia/analgesia/anxiolysis/sedation: None required Medication (Right): Qutenza  (capsaicin  8%) topical system Medication (Left): Qutenza  (capsaicin  8%) topical system Date: 07/03/2024 Performed by: Wallie Sherry, MD Rationale (medical necessity): procedure needed and proper for the treatment of Mr. Gilardi's medical symptoms and needs. Indication: Painful diabetic peripheral neuralgia (DPN) (ICD-10-CM:E11.40) severe enough to impact quality of life or function. 1. Chronic painful diabetic neuropathy (HCC)   2. Chronic pain syndrome    NAS-11 Pain score:   Pre-procedure: 4 /10   Post-procedure: 4 /10     Position / Prep / Materials:  Position: Supine  Materials: Qutenza  Kit (4 patches)  H&P (Pre-op Assessment):  Ricardo Lutz is a 64 y.o. (year old), male patient, seen today for interventional treatment. He  has a past surgical history that includes Prostate biopsy; Skin cancer excision; Colonoscopy; and Robot assisted laparoscopic radical prostatectomy (N/A, 01/09/2024). Ricardo Lutz has a current medication list which  includes the following prescription(s): cetirizine, duloxetine  hcl, metformin, rosuvastatin , hydrocodone -acetaminophen , methocarbamol , polyethylene glycol powder, and sulfamethoxazole -trimethoprim . His primarily concern today is the Foot Pain (bilareral)  Initial Vital Signs:  Pulse/HCG Rate: 97  Temp: 97.9 F (36.6 C) Resp: 18 BP: 137/88 SpO2: 100 %  BMI: Estimated body mass index is 24.27 kg/m as calculated from the following:   Height as of this encounter: 6' 2 (1.88 m).   Weight as of this encounter: 189 lb (85.7 kg).  Risk Assessment: Allergies: Reviewed. He has no known allergies.  Allergy Precautions: None required Coagulopathies: Reviewed. None identified.  Blood-thinner therapy: None at this time Active Infection(s): Reviewed. None identified. Ricardo Lutz is afebrile  Site Confirmation: Ricardo Lutz was asked to confirm the procedure and laterality before marking the site Procedure checklist: Completed Consent: Before the procedure and under the influence of no sedative(s), amnesic(s), or anxiolytics, the patient was informed of the treatment options, risks and possible complications. To fulfill our ethical and legal obligations, as recommended by the American Medical Association's Code of Ethics, I have informed the patient of my clinical impression; the nature and purpose of the treatment or procedure; the risks, benefits, and possible complications of the intervention; the alternatives, including doing nothing; the risk(s) and benefit(s) of the alternative treatment(s) or procedure(s); and the risk(s) and benefit(s) of doing nothing. The patient was provided information about the general risks and possible complications associated with the procedure. These may include, but are not limited to: failure to achieve desired goals, infection, bleeding, organ or nerve damage, allergic reactions, paralysis, and death. In addition, the patient was informed of those risks and  complications associated to the procedure, such as failure to decrease pain; infection; bleeding; organ or nerve damage with subsequent damage to sensory, motor, and/or autonomic systems, resulting in permanent pain, numbness, and/or weakness of one or several areas of the body; allergic reactions; (i.e.: anaphylactic reaction); and/or  death. Furthermore, the patient was informed of those risks and complications associated with the medications. These include, but are not limited to: allergic reactions (i.e.: anaphylactic or anaphylactoid reaction(s)); adrenal axis suppression; blood sugar elevation that in diabetics may result in ketoacidosis or comma; water retention that in patients with history of congestive heart failure may result in shortness of breath, pulmonary edema, and decompensation with resultant heart failure; weight gain; swelling or edema; medication-induced neural toxicity; particulate matter embolism and blood vessel occlusion with resultant organ, and/or nervous system infarction; and/or aseptic necrosis of one or more joints. Finally, the patient was informed that Medicine is not an exact science; therefore, there is also the possibility of unforeseen or unpredictable risks and/or possible complications that may result in a catastrophic outcome. The patient indicated having understood very clearly. We have given the patient no guarantees and we have made no promises. Enough time was given to the patient to ask questions, all of which were answered to the patient's satisfaction. Ricardo Lutz has indicated that he wanted to continue with the procedure. Attestation: I, the ordering provider, attest that I have discussed with the patient the benefits, risks, side-effects, alternatives, likelihood of achieving goals, and potential problems during recovery for the procedure that I have provided informed consent. Date  Time: 07/03/2024 12:36 PM  Pre-Procedure Preparation:  Monitoring: As per  clinic protocol. Respiration, ETCO2, SpO2, BP, heart rate and rhythm monitor placed and checked for adequate function Safety Precautions: Patient was assessed for positional comfort and pressure points before starting the procedure. Time-out: I initiated and conducted the Time-out before starting the procedure, as per protocol. The patient was asked to participate by confirming the accuracy of the Time Out information. Verification of the correct person, site, and procedure were performed and confirmed by me, the nursing staff, and the patient. Time-out conducted as per Joint Commission's Universal Protocol (UP.01.01.01). Time:   Start Time: 1324 hrs.  Description/Narrative of Procedure:          Region: Distal lower extremities Target Area: Sensory peripheral nerves affected by diabetic peripheral neuropathy Site: Feet Approach: Percutaneous  No./Series: Not applicable  Type: Percutaneous  Purpose: Therapeutic  Region: Distal lower extremities  Start Time: 1324 hrs.  Description of the Procedure: Protocol guidelines were followed. The patient was assisted into a comfortable position.  Informed consent was obtained in the patient monitored in the usual manner.  All questions were answered prior to the procedure.  They Qutenza  patches were applied to the affected area and then covered with the wrap.  The Patient was kept under observation until the treatment was completed.  The patches were removed and the treated area was inspected.  Vitals:   07/03/24 1301  BP: 137/88  Pulse: 97  Resp: 18  Temp: 97.9 F (36.6 C)  TempSrc: Temporal  SpO2: 100%  Weight: 189 lb (85.7 kg)  Height: 6' 2 (1.88 m)     End Time: 1354 hrs.  Type of Imaging Technique: None used Indication(s): N/A Exposure Time: No patient exposure Contrast: None used. Fluoroscopic Guidance: N/A Ultrasound Guidance: N/A Interpretation: N/A  Post-operative Assessment:  Post-procedure Vital Signs:  Pulse/HCG  Rate: 97  Temp: 97.9 F (36.6 C) Resp: 18 BP: 137/88 SpO2: 100 %  EBL: None  Complications: No immediate post-treatment complications observed by team, or reported by patient.  Note: The patient tolerated the entire procedure well. A repeat set of vitals were taken after the procedure and the patient was kept under observation following institutional  policy, for this type of procedure. Post-procedural neurological assessment was performed, showing return to baseline, prior to discharge. The patient was provided with post-procedure discharge instructions, including a section on how to identify potential problems. Should any problems arise concerning this procedure, the patient was given instructions to immediately contact us , at any time, without hesitation. In any case, we plan to contact the patient by telephone for a follow-up status report regarding this interventional procedure.  Comments:  No additional relevant information.  Plan of Care (POC)  Orders:  Orders Placed This Encounter  Procedures   NEUROLYSIS    Please order Qutenza  patches    Standing Status:   Standing    Number of Occurrences:   3    Next Expected Occurrence:   09/20/2024    Expiration Date:   07/03/2025    Where will this procedure be performed?:   [facility]     Medications ordered for procedure: Meds ordered this encounter  Medications   capsaicin  topical system 8 % patch 4 patch   Medications administered: We administered capsaicin  topical system.  See the medical record for exact dosing, route, and time of administration.    Qutenza  No. 1 04/03/2024, No 2. 07/03/24    Follow-up plan:   Return in about 3 months (around 10/01/2024) for Qutenza  #3.     Recent Visits No visits were found meeting these conditions. Showing recent visits within past 90 days and meeting all other requirements Today's Visits Date Type Provider Dept  07/03/24 Procedure visit Marcelino Nurse, MD [facility]  Showing today's visits and meeting all other requirements Future Appointments Date Type Provider Dept  08/15/24 Appointment Marcelino Nurse, MD [facility]  Showing future appointments within next 90 days and meeting all other requirements   Disposition: Discharge home  Discharge (Date  Time): 07/03/2024; 1405 hrs.   Primary Care Physician: Leonel Cole, MD Location: [facility] Note by: Nurse Marcelino, MD (TTS technology used. I apologize for any typographical errors that were not detected and corrected.) Date: 07/03/2024; Time: 4:03 PM  Disclaimer:  Medicine is not an visual merchandiser. The only guarantee in medicine is that nothing is guaranteed. It is important to note that the decision to proceed with this intervention was based on the information collected from the patient. The Data and conclusions were drawn from the patient's questionnaire, the interview, and the physical examination. Because the information was provided in large part by the patient, it cannot be guaranteed that it has not been purposely or unconsciously manipulated. Every effort has been made to obtain as much relevant data as possible for this evaluation. It is important to note that the conclusions that lead to this procedure are derived in large part from the available data. Always take into account that the treatment will also be dependent on availability of resources and existing treatment guidelines, considered by other Pain Management Practitioners as being common knowledge and practice, at the time of the intervention. For Medico-Legal purposes, it is also important to point out that variation in procedural techniques and pharmacological choices are the acceptable norm. The indications, contraindications, technique, and results of the above procedure should only be interpreted and judged by a Board-Certified Interventional Pain Specialist with extensive familiarity and expertise  in the same exact procedure and technique.

## 2024-07-04 ENCOUNTER — Telehealth: Payer: Self-pay | Admitting: *Deleted

## 2024-07-04 NOTE — Telephone Encounter (Signed)
 Post procedure call; voicemail left

## 2024-08-15 ENCOUNTER — Telehealth: Admitting: Student in an Organized Health Care Education/Training Program
